# Patient Record
Sex: Female | Born: 1990 | Race: White | Hispanic: No | Marital: Married | Smoking: Never smoker
Health system: Southern US, Community
[De-identification: ages and names within clinical notes are randomized; demographics above are authoritative.]

## PROBLEM LIST (undated history)

## (undated) DIAGNOSIS — G43909 Migraine, unspecified, not intractable, without status migrainosus: Secondary | ICD-10-CM

## (undated) DIAGNOSIS — E119 Type 2 diabetes mellitus without complications: Secondary | ICD-10-CM

## (undated) DIAGNOSIS — R519 Headache, unspecified: Secondary | ICD-10-CM

## (undated) DIAGNOSIS — K649 Unspecified hemorrhoids: Secondary | ICD-10-CM

## (undated) DIAGNOSIS — R569 Unspecified convulsions: Secondary | ICD-10-CM

## (undated) DIAGNOSIS — R51 Headache: Secondary | ICD-10-CM

## (undated) DIAGNOSIS — K602 Anal fissure, unspecified: Secondary | ICD-10-CM

## (undated) HISTORY — DX: Type 2 diabetes mellitus without complications: E11.9

## (undated) HISTORY — DX: Migraine, unspecified, not intractable, without status migrainosus: G43.909

## (undated) HISTORY — PX: TUBAL LIGATION: SHX77

## (undated) HISTORY — DX: Unspecified convulsions: R56.9

## (undated) HISTORY — DX: Unspecified hemorrhoids: K64.9

## (undated) HISTORY — DX: Headache: R51

## (undated) HISTORY — DX: Headache, unspecified: R51.9

## (undated) HISTORY — DX: Anal fissure, unspecified: K60.2

---
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
## 2014-06-15 ENCOUNTER — Encounter: Payer: Self-pay | Admitting: Family Medicine

## 2014-06-15 ENCOUNTER — Telehealth: Payer: Self-pay | Admitting: Family Medicine

## 2014-06-15 ENCOUNTER — Encounter (INDEPENDENT_AMBULATORY_CARE_PROVIDER_SITE_OTHER): Payer: Self-pay

## 2014-06-15 ENCOUNTER — Ambulatory Visit: Payer: Self-pay | Admitting: Family Medicine

## 2014-06-15 VITALS — BP 116/79 | HR 100 | Temp 99.1°F | Ht 60.0 in | Wt 93.8 lb

## 2014-06-15 DIAGNOSIS — R300 Dysuria: Secondary | ICD-10-CM

## 2014-06-15 LAB — POCT UA - MICROSCOPIC ONLY
Casts, Ur, LPF, POC: NEGATIVE
Crystals, Ur, HPF, POC: NEGATIVE
RBC, urine, microscopic: NEGATIVE
Yeast, UA: NEGATIVE

## 2014-06-15 LAB — POCT URINALYSIS DIPSTICK
Bilirubin, UA: NEGATIVE
Blood, UA: NEGATIVE
Glucose, UA: NEGATIVE
Ketones, UA: NEGATIVE
Leukocytes, UA: NEGATIVE
Nitrite, UA: NEGATIVE
Spec Grav, UA: 1.01
Urobilinogen, UA: NEGATIVE
pH, UA: 8

## 2014-06-15 MED ORDER — PHENAZOPYRIDINE HCL 200 MG PO TABS: 200.0000 mg | ORAL_TABLET | Freq: Three times a day (TID) | ORAL | Status: DC | PRN

## 2014-06-15 MED ORDER — CIPROFLOXACIN HCL 500 MG PO TABS
500.0000 mg | ORAL_TABLET | Freq: Two times a day (BID) | ORAL | Status: DC
Start: 2014-06-15 — End: 2014-06-17

## 2014-06-15 NOTE — Progress Notes (Signed)
   Subjective:    Patient ID: Jessica Ferguson, female    DOB: 04/15/1990, 23 y.o.   MRN: [id number]  HPI  C/o urinary frequency and dysuria for 3 days  Review of Systems No chest pain, SOB, HA, dizziness, vision change, N/V, diarrhea, constipation, dysuria, urinary urgency or frequency, myalgias, arthralgias or rash.     Objective:   Physical Exam  Vital signs noted  Well developed well nourished female.  HEENT - Head atraumatic Normocephalic Respiratory - Lungs CTA bilateral Cardiac - RRR S1 and S2 without murmur GI - Abdomen soft Nontender and bowel sounds active x 4 Extremities - No edema. Neuro - Grossly intact.      Assessment & Plan:  Dysuria - Plan: POCT urinalysis dipstick, POCT UA - Microscopic Only, Urine culture, ciprofloxacin (CIPRO) 500 MG tablet, phenazopyridine (PYRIDIUM) 200 MG tablet  Deatra Canter FNP

## 2014-06-15 NOTE — Telephone Encounter (Signed)
appt made

## 2014-06-16 ENCOUNTER — Telehealth: Payer: Self-pay | Admitting: Family Medicine

## 2014-06-17 ENCOUNTER — Other Ambulatory Visit: Payer: Self-pay | Admitting: Family Medicine

## 2014-06-17 DIAGNOSIS — R300 Dysuria: Secondary | ICD-10-CM

## 2014-06-17 MED ORDER — PHENAZOPYRIDINE HCL 200 MG PO TABS: 200.0000 mg | ORAL_TABLET | Freq: Three times a day (TID) | ORAL | Status: DC | PRN

## 2014-06-17 MED ORDER — CIPROFLOXACIN HCL 500 MG PO TABS: 500.0000 mg | ORAL_TABLET | Freq: Two times a day (BID) | ORAL | Status: DC

## 2014-06-17 NOTE — Telephone Encounter (Signed)
The cipro and pyridium were sent when patient was seen and was re-sent today

## 2014-06-17 NOTE — Telephone Encounter (Signed)
Detailed message left that rx was resent to pharmacy.

## 2014-08-24 ENCOUNTER — Ambulatory Visit (INDEPENDENT_AMBULATORY_CARE_PROVIDER_SITE_OTHER): Payer: Self-pay | Admitting: Nurse Practitioner

## 2014-08-24 ENCOUNTER — Encounter: Payer: Self-pay | Admitting: Nurse Practitioner

## 2014-08-24 VITALS — BP 117/79 | HR 88 | Temp 99.1°F | Ht 60.0 in | Wt 95.0 lb

## 2014-08-24 DIAGNOSIS — R599 Enlarged lymph nodes, unspecified: Secondary | ICD-10-CM

## 2014-08-24 DIAGNOSIS — R590 Localized enlarged lymph nodes: Secondary | ICD-10-CM

## 2014-08-24 MED ORDER — AMOXICILLIN 875 MG PO TABS: 875.0000 mg | ORAL_TABLET | Freq: Two times a day (BID) | ORAL | Status: DC

## 2014-08-24 NOTE — Progress Notes (Signed)
   Subjective:    Patient ID: Jessica Ferguson, female    DOB: Jun 25, 1991, 23 y.o.   MRN: [id number]  HPI patient in today c/o sore throat- started 1 week ago- getting worse- starting to loose her voice.    Review of Systems  Constitutional: Positive for fever (low grade). Negative for chills and appetite change.  HENT: Positive for sore throat, trouble swallowing and voice change. Negative for congestion.   Respiratory: Positive for cough (slight ).   Cardiovascular: Negative.   Genitourinary: Negative.   Musculoskeletal: Negative.   Neurological: Negative.   Psychiatric/Behavioral: Negative.   All other systems reviewed and are negative.      Objective:   Physical Exam  Constitutional: She is oriented to person, place, and time. She appears well-developed and well-nourished. No distress.  HENT:  Right Ear: Hearing, tympanic membrane, external ear and ear canal normal.  Left Ear: Hearing, tympanic membrane, external ear and ear canal normal.  Nose: Mucosal edema and rhinorrhea present. Right sinus exhibits no maxillary sinus tenderness and no frontal sinus tenderness. Left sinus exhibits no maxillary sinus tenderness and no frontal sinus tenderness.  Mouth/Throat: Uvula is midline. No oropharyngeal exudate, posterior oropharyngeal edema, posterior oropharyngeal erythema or tonsillar abscesses.  Halitosis Left jaw edema   Eyes: Pupils are equal, round, and reactive to light.  Neck: Normal range of motion.  Cardiovascular: Normal rate, regular rhythm and normal heart sounds.   Pulmonary/Chest: Effort normal and breath sounds normal.  Lymphadenopathy:    She has cervical adenopathy (left tonsillar with tenderness).  Neurological: She is alert and oriented to person, place, and time.  Skin: Skin is warm and dry.  Psychiatric: She has a normal mood and affect. Her behavior is normal. Judgment and thought content normal.    BP 117/79 mmHg  Pulse 88  Temp(Src) 99.1 F (37.3 C)  (Oral)  Ht 5' (1.524 m)  Wt 95 lb (43.092 kg)  BMI 18.55 kg/m2       Assessment & Plan:   1. Lymphadenopathy of left cervical region    Meds ordered this encounter  Medications  . amoxicillin (AMOXIL) 875 MG tablet    Sig: Take 1 tablet (875 mg total) by mouth 2 (two) times daily.    Dispense:  20 tablet    Refill:  0    Order Specific Question:  Supervising Provider    Answer:  Deborra MedinaMOORE, DONALD W [[id number]]   Force fluids Motrin or tylenol OTC OTC decongestant Throat lozenges if help New toothbrush in 3 days  Mary-Margaret Daphine DeutscherMartin, FNP

## 2014-08-24 NOTE — Patient Instructions (Signed)
Force fluids °Motrin or tylenol OTC °OTC decongestant °Throat lozenges if help °New toothbrush in 3 days ° °

## 2014-09-24 ENCOUNTER — Ambulatory Visit: Payer: Medicaid Other

## 2014-11-01 ENCOUNTER — Encounter: Payer: Self-pay | Admitting: Family

## 2014-11-01 ENCOUNTER — Ambulatory Visit (INDEPENDENT_AMBULATORY_CARE_PROVIDER_SITE_OTHER): Payer: Self-pay | Admitting: Family

## 2014-11-01 VITALS — BP 147/87 | HR 111 | Temp 99.1°F | Ht 60.0 in | Wt 100.2 lb

## 2014-11-01 DIAGNOSIS — R509 Fever, unspecified: Secondary | ICD-10-CM

## 2014-11-01 DIAGNOSIS — N390 Urinary tract infection, site not specified: Secondary | ICD-10-CM

## 2014-11-01 DIAGNOSIS — M545 Low back pain: Secondary | ICD-10-CM

## 2014-11-01 DIAGNOSIS — N912 Amenorrhea, unspecified: Secondary | ICD-10-CM

## 2014-11-01 LAB — POCT CBC
Granulocyte percent: 74.2 %G (ref 37–80)
HEMATOCRIT: 42.7 % (ref 37.7–47.9)
Hemoglobin: 13.6 g/dL (ref 12.2–16.2)
Lymph, poc: 1.7 (ref 0.6–3.4)
MCH, POC: 27.3 pg (ref 27–31.2)
MCHC: 31.9 g/dL (ref 31.8–35.4)
MCV: 85.7 fL (ref 80–97)
MPV: 10.2 fL (ref 0–99.8)
PLATELET COUNT, POC: 191 10*3/uL (ref 142–424)
POC GRANULOCYTE: 6.2 (ref 2–6.9)
POC LYMPH PERCENT: 20.2 %L (ref 10–50)
RBC: 5 M/uL (ref 4.04–5.48)
RDW, POC: 12.6 %
WBC: 8.4 10*3/uL (ref 4.6–10.2)

## 2014-11-01 LAB — POCT URINALYSIS DIPSTICK
BILIRUBIN UA: NEGATIVE
Blood, UA: NEGATIVE
GLUCOSE UA: NEGATIVE
KETONES UA: NEGATIVE
LEUKOCYTES UA: NEGATIVE
Nitrite, UA: NEGATIVE
Protein, UA: NEGATIVE
Urobilinogen, UA: NEGATIVE
pH, UA: 8

## 2014-11-01 LAB — POCT UA - MICROSCOPIC ONLY
CRYSTALS, UR, HPF, POC: NEGATIVE
Casts, Ur, LPF, POC: NEGATIVE
Mucus, UA: NEGATIVE
YEAST UA: NEGATIVE

## 2014-11-01 LAB — POCT URINE PREGNANCY: Preg Test, Ur: NEGATIVE

## 2014-11-01 MED ORDER — SULFAMETHOXAZOLE-TRIMETHOPRIM 800-160 MG PO TABS: 1.0000 | ORAL_TABLET | Freq: Two times a day (BID) | ORAL | Status: DC

## 2014-11-01 MED ORDER — CYCLOBENZAPRINE HCL 5 MG PO TABS: 5.0000 mg | ORAL_TABLET | Freq: Three times a day (TID) | ORAL | Status: DC | PRN

## 2014-11-01 NOTE — Progress Notes (Signed)
   Subjective:    Patient ID: Jessica Ferguson, female    DOB: 04-17-1991, 24 y.o.   MRN: [id number]  HPI Pt presents to the office with back pain and nipple pain. Pt states she can not sleep on her back, eating a lot more than usual, and has missed her period. Pt states she has taken two pregnancy test at home which were both negative. Pt states she is not sexual "anymore" since becoming "bloated my sex drive is gone".    Review of Systems  Constitutional: Positive for fatigue.  HENT: Negative.   Eyes: Negative.   Respiratory: Negative.  Negative for shortness of breath.   Cardiovascular: Negative.  Negative for palpitations.  Gastrointestinal: Positive for nausea. Negative for vomiting.  Endocrine: Negative.   Genitourinary: Positive for vaginal discharge. Negative for dysuria and pelvic pain.  Musculoskeletal: Negative.   Neurological: Negative.  Negative for headaches.  Hematological: Negative.   Psychiatric/Behavioral: Negative.   All other systems reviewed and are negative.      Objective:   Physical Exam  Constitutional: She is oriented to person, place, and time. She appears well-developed and well-nourished. No distress.  Eyes: Pupils are equal, round, and reactive to light.  Neck: Normal range of motion. Neck supple. No thyromegaly present.  Cardiovascular: Normal rate, regular rhythm, normal heart sounds and intact distal pulses.   No murmur heard. Pulmonary/Chest: Effort normal and breath sounds normal. No respiratory distress. She has no wheezes.  Abdominal: Soft. Bowel sounds are normal. She exhibits no distension. There is no tenderness.  Musculoskeletal: Normal range of motion. She exhibits no edema or tenderness.  Neurological: She is alert and oriented to person, place, and time. She has normal reflexes. No cranial nerve deficit.  Skin: Skin is warm and dry.  Psychiatric: She has a normal mood and affect. Her behavior is normal. Judgment and thought content  normal.  Vitals reviewed.    BP 147/87 mmHg  Pulse 111  Temp(Src) 99.1 F (37.3 C) (Oral)  Ht 5' (1.524 m)  Wt 100 lb 3.2 oz (45.45 kg)  BMI 19.57 kg/m2  LMP 09/17/2014      Assessment & Plan:  1. Amenorrhea - POCT urine pregnancy  2. Low back pain, unspecified back pain laterality, with sciatica presence unspecified -Rest -Ice -Sedation precaution discussed - POCT UA - Microscopic Only - POCT urinalysis dipstick - POCT CBC - cyclobenzaprine (FLEXERIL) 5 MG tablet; Take 1 tablet (5 mg total) by mouth 3 (three) times daily as needed for muscle spasms.  Dispense: 30 tablet; Refill: 1  3. Fever, unspecified fever cause - POCT CBC  4. Urinary tract infection without hematuria, site unspecified Force fluids AZO over the counter X2 days RTO prn - sulfamethoxazole-trimethoprim (BACTRIM DS,SEPTRA DS) 800-160 MG per tablet; Take 1 tablet by mouth 2 (two) times daily.  Dispense: 10 tablet; Refill: 0   Jannifer Rodneyhristy Jennice Renegar, FNP

## 2014-11-01 NOTE — Patient Instructions (Signed)
Back Pain, Adult Low back pain is very common. About 1 in 5 people have back pain.The cause of low back pain is rarely dangerous. The pain often gets better over time.About half of people with a sudden onset of back pain feel better in just 2 weeks. About 8 in 10 people feel better by 6 weeks.  CAUSES Some common causes of back pain include:  Strain of the muscles or ligaments supporting the spine.  Wear and tear (degeneration) of the spinal discs.  Arthritis.  Direct injury to the back. DIAGNOSIS Most of the time, the direct cause of low back pain is not known.However, back pain can be treated effectively even when the exact cause of the pain is unknown.Answering your caregiver's questions about your overall health and symptoms is one of the most accurate ways to make sure the cause of your pain is not dangerous. If your caregiver needs more information, he or she may order lab work or imaging tests (X-rays or MRIs).However, even if imaging tests show changes in your back, this usually does not require surgery. HOME CARE INSTRUCTIONS For many people, back pain returns.Since low back pain is rarely dangerous, it is often a condition that people can learn to manageon their own.   Remain active. It is stressful on the back to sit or stand in one place. Do not sit, drive, or stand in one place for more than 30 minutes at a time. Take short walks on level surfaces as soon as pain allows.Try to increase the length of time you walk each day.  Do not stay in bed.Resting more than 1 or 2 days can delay your recovery.  Do not avoid exercise or work.Your body is made to move.It is not dangerous to be active, even though your back may hurt.Your back will likely heal faster if you return to being active before your pain is gone.  Pay attention to your body when you bend and lift. Many people have less discomfortwhen lifting if they bend their knees, keep the load close to their bodies,and  avoid twisting. Often, the most comfortable positions are those that put less stress on your recovering back.  Find a comfortable position to sleep. Use a firm mattress and lie on your side with your knees slightly bent. If you lie on your back, put a pillow under your knees.  Only take over-the-counter or prescription medicines as directed by your caregiver. Over-the-counter medicines to reduce pain and inflammation are often the most helpful.Your caregiver may prescribe muscle relaxant drugs.These medicines help dull your pain so you can more quickly return to your normal activities and healthy exercise.  Put ice on the injured area.  Put ice in a plastic bag.  Place a towel between your skin and the bag.  Leave the ice on for 15-20 minutes, 03-04 times a day for the first 2 to 3 days. After that, ice and heat may be alternated to reduce pain and spasms.  Ask your caregiver about trying back exercises and gentle massage. This may be of some benefit.  Avoid feeling anxious or stressed.Stress increases muscle tension and can worsen back pain.It is important to recognize when you are anxious or stressed and learn ways to manage it.Exercise is a great option. SEEK MEDICAL CARE IF:  You have pain that is not relieved with rest or medicine.  You have pain that does not improve in 1 week.  You have new symptoms.  You are generally not feeling well. SEEK   IMMEDIATE MEDICAL CARE IF:   You have pain that radiates from your back into your legs.  You develop new bowel or bladder control problems.  You have unusual weakness or numbness in your arms or legs.  You develop nausea or vomiting.  You develop abdominal pain.  You feel faint. Document Released: 10/08/2005 Document Revised: 04/08/2012 Document Reviewed: 02/09/2014 ExitCare Patient Information 2015 [employer]. This information is not intended to replace advice given to you by your health care provider. Make sure you  discuss any questions you have with your health care provider.  

## 2016-05-14 ENCOUNTER — Ambulatory Visit (INDEPENDENT_AMBULATORY_CARE_PROVIDER_SITE_OTHER): Payer: Self-pay | Admitting: Family

## 2016-05-14 VITALS — BP 111/75 | HR 82 | Temp 97.5°F | Ht 60.0 in | Wt 93.2 lb

## 2016-05-14 DIAGNOSIS — J029 Acute pharyngitis, unspecified: Secondary | ICD-10-CM

## 2016-05-14 DIAGNOSIS — M545 Low back pain: Secondary | ICD-10-CM

## 2016-05-14 DIAGNOSIS — J069 Acute upper respiratory infection, unspecified: Secondary | ICD-10-CM

## 2016-05-14 LAB — RAPID STREP SCREEN (MED CTR MEBANE ONLY): Strep Gp A Ag, IA W/Reflex: NEGATIVE

## 2016-05-14 LAB — CULTURE, GROUP A STREP

## 2016-05-14 MED ORDER — CYCLOBENZAPRINE HCL 5 MG PO TABS: 5.0000 mg | ORAL_TABLET | Freq: Three times a day (TID) | ORAL | 1 refills | Status: DC | PRN

## 2016-05-14 NOTE — Addendum Note (Signed)
Addended by: Jannifer Rodney A on: 05/14/2016 05:28 PM   Modules accepted: Orders

## 2016-05-14 NOTE — Patient Instructions (Signed)
Upper Respiratory Infection, Adult Most upper respiratory infections (URIs) are a viral infection of the air passages leading to the lungs. A URI affects the nose, throat, and upper air passages. The most common type of URI is nasopharyngitis and is typically referred to as "the common cold." URIs run their course and usually go away on their own. Most of the time, a URI does not require medical attention, but sometimes a bacterial infection in the upper airways can follow a viral infection. This is called a secondary infection. Sinus and middle ear infections are common types of secondary upper respiratory infections. Bacterial pneumonia can also complicate a URI. A URI can worsen asthma and chronic obstructive pulmonary disease (COPD). Sometimes, these complications can require emergency medical care and may be life threatening.  CAUSES Almost all URIs are caused by viruses. A virus is a type of germ and can spread from one person to another.  RISKS FACTORS You may be at risk for a URI if:   You smoke.   You have chronic heart or lung disease.  You have a weakened defense (immune) system.   You are very young or very old.   You have nasal allergies or asthma.  You work in crowded or poorly ventilated areas.  You work in health care facilities or schools. SIGNS AND SYMPTOMS  Symptoms typically develop 2-3 days after you come in contact with a cold virus. Most viral URIs last 7-10 days. However, viral URIs from the influenza virus (flu virus) can last 14-18 days and are typically more severe. Symptoms may include:   Runny or stuffy (congested) nose.   Sneezing.   Cough.   Sore throat.   Headache.   Fatigue.   Fever.   Loss of appetite.   Pain in your forehead, behind your eyes, and over your cheekbones (sinus pain).  Muscle aches.  DIAGNOSIS  Your health care provider may diagnose a URI by:  Physical exam.  Tests to check that your symptoms are not due to  another condition such as:  Strep throat.  Sinusitis.  Pneumonia.  Asthma. TREATMENT  A URI goes away on its own with time. It cannot be cured with medicines, but medicines may be prescribed or recommended to relieve symptoms. Medicines may help:  Reduce your fever.  Reduce your cough.  Relieve nasal congestion. HOME CARE INSTRUCTIONS   Take medicines only as directed by your health care provider.   Gargle warm saltwater or take cough drops to comfort your throat as directed by your health care provider.  Use a warm mist humidifier or inhale steam from a shower to increase air moisture. This may make it easier to breathe.  Drink enough fluid to keep your urine clear or pale yellow.   Eat soups and other clear broths and maintain good nutrition.   Rest as needed.   Return to work when your temperature has returned to normal or as your health care provider advises. You may need to stay home longer to avoid infecting others. You can also use a face mask and careful hand washing to prevent spread of the virus.  Increase the usage of your inhaler if you have asthma.   Do not use any tobacco products, including cigarettes, chewing tobacco, or electronic cigarettes. If you need help quitting, ask your health care provider. PREVENTION  The best way to protect yourself from getting a cold is to practice good hygiene.   Avoid oral or hand contact with people with cold   symptoms.   Wash your hands often if contact occurs.  There is no clear evidence that vitamin C, vitamin E, echinacea, or exercise reduces the chance of developing a cold. However, it is always recommended to get plenty of rest, exercise, and practice good nutrition.  SEEK MEDICAL CARE IF:   You are getting worse rather than better.   Your symptoms are not controlled by medicine.   You have chills.  You have worsening shortness of breath.  You have brown or red mucus.  You have yellow or brown nasal  discharge.  You have pain in your face, especially when you bend forward.  You have a fever.  You have swollen neck glands.  You have pain while swallowing.  You have white areas in the back of your throat. SEEK IMMEDIATE MEDICAL CARE IF:   You have severe or persistent:  Headache.  Ear pain.  Sinus pain.  Chest pain.  You have chronic lung disease and any of the following:  Wheezing.  Prolonged cough.  Coughing up blood.  A change in your usual mucus.  You have a stiff neck.  You have changes in your:  Vision.  Hearing.  Thinking.  Mood. MAKE SURE YOU:   Understand these instructions.  Will watch your condition.  Will get help right away if you are not doing well or get worse.   This information is not intended to replace advice given to you by your health care provider. Make sure you discuss any questions you have with your health care provider.   Document Released: 04/03/2001 Document Revised: 02/22/2015 Document Reviewed: 01/13/2014 Elsevier Interactive Patient Education 2016 [employer]  - Take meds as prescribed - Use a cool mist humidifier  -Use saline nose sprays frequently -Saline irrigations of the nose can be very helpful if done frequently.  * 4X daily for 1 week*  * Use of a nettie pot can be helpful with this. Follow directions with this* -Force fluids -For any cough or congestion  Use plain Mucinex- regular strength or max strength is fine   * Children- consult with Pharmacist for dosing -For fever or aces or pains- take tylenol or ibuprofen appropriate for age and weight.  * for fevers greater than 101 orally you may alternate ibuprofen and tylenol every  3 hours. -Throat lozenges if help -New toothbrush in 3 days   Saburo Luger, FNP  

## 2016-05-14 NOTE — Progress Notes (Signed)
   Subjective:    Patient ID: Jessica Ferguson, female    DOB: 05/29/1991, 25 y.o.   MRN: [id number]  Emesis   This is a new problem. The current episode started yesterday. The problem occurs 2 to 4 times per day. The problem has been unchanged. The emesis has an appearance of stomach contents. There has been no fever. Associated symptoms include coughing. Pertinent negatives include no chills, dizziness or headaches. She has tried bed rest and increased fluids for the symptoms. The treatment provided mild relief.  Cough  Associated symptoms include ear pain and a sore throat. Pertinent negatives include no chills or headaches.      Review of Systems  Constitutional: Negative for chills.  HENT: Positive for ear pain, sinus pressure and sore throat.   Respiratory: Positive for cough.   Gastrointestinal: Positive for vomiting.  Neurological: Negative for dizziness and headaches.       Objective:   Physical Exam  Constitutional: She is oriented to person, place, and time. She appears well-developed and well-nourished. No distress.  HENT:  Head: Normocephalic and atraumatic.  Nose: Mucosal edema and rhinorrhea present. Right sinus exhibits maxillary sinus tenderness. Left sinus exhibits maxillary sinus tenderness.  Mouth/Throat: Posterior oropharyngeal edema present.  Eyes: Pupils are equal, round, and reactive to light.  Neck: Normal range of motion. Neck supple. No thyromegaly present.  Cardiovascular: Normal rate, regular rhythm, normal heart sounds and intact distal pulses.   No murmur heard. Pulmonary/Chest: Effort normal and breath sounds normal. No respiratory distress. She has no wheezes.  Abdominal: Soft. Bowel sounds are normal. She exhibits no distension. There is no tenderness.  Musculoskeletal: Normal range of motion. She exhibits no edema or tenderness.  Neurological: She is alert and oriented to person, place, and time. She has normal reflexes. No cranial nerve deficit.    Skin: Skin is warm and dry.  Psychiatric: She has a normal mood and affect. Her behavior is normal. Judgment and thought content normal.  Vitals reviewed.     BP 111/75   Pulse 82   Temp 97.5 F (36.4 C) (Oral)   Ht 5' (1.524 m)   Wt 93 lb 3.2 oz (42.3 kg)   BMI 18.20 kg/m      Assessment & Plan:  1. Acute upper respiratory infection -- Take meds as prescribed - Use a cool mist humidifier  -Use saline nose sprays frequently -Saline irrigations of the nose can be very helpful if done frequently.  * 4X daily for 1 week*  * Use of a nettie pot can be helpful with this. Follow directions with this* -Force fluids -For any cough or congestion  Use plain Mucinex- regular strength or max strength is fine   * Children- consult with Pharmacist for dosing -For fever or aces or pains- take tylenol or ibuprofen appropriate for age and weight.  * for fevers greater than 101 orally you may alternate ibuprofen and tylenol every  3 hours. -Throat lozenges if help -New toothbrush in 3 days   2. Sore throat  Jannifer Rodney, FNP  - Rapid strep screen (not at [facility])

## 2016-05-17 ENCOUNTER — Encounter: Payer: Self-pay | Admitting: Pediatrics

## 2016-05-17 ENCOUNTER — Ambulatory Visit (INDEPENDENT_AMBULATORY_CARE_PROVIDER_SITE_OTHER): Payer: Self-pay | Admitting: Pediatrics

## 2016-05-17 VITALS — BP 118/79 | HR 83 | Temp 98.6°F | Ht 60.0 in | Wt 92.6 lb

## 2016-05-17 DIAGNOSIS — Z01419 Encounter for gynecological examination (general) (routine) without abnormal findings: Secondary | ICD-10-CM

## 2016-05-17 DIAGNOSIS — Z0000 Encounter for general adult medical examination without abnormal findings: Secondary | ICD-10-CM

## 2016-05-17 DIAGNOSIS — J069 Acute upper respiratory infection, unspecified: Secondary | ICD-10-CM

## 2016-05-17 NOTE — Patient Instructions (Signed)
Take a prenatal vitamin daily.

## 2016-05-17 NOTE — Progress Notes (Signed)
    Subjective:    Patient ID: Jessica Ferguson, female    DOB: [birth year], 25 y.o.   MRN: [id number]  CC: Annual Exam and Gynecologic Exam   HPI: Jessica Ferguson is a 25 y.o. female presenting for Annual Exam and Gynecologic Exam  Interested in having children Was on depo-provera--last shot about a year Was using barrier protection Having periods every 4-5 weeks Last for about 4 days Heavy bleeding, clotting during   Fam hx: no breast or colon cancer   Depression screen [facility] 2/9 05/14/2016 11/01/2014 06/15/2014  Decreased Interest 0 0 0  Down, Depressed, Hopeless 0 0 0  PHQ - 2 Score 0 0 0     Relevant past medical, surgical, family and social history reviewed and updated as indicated.  Interim medical history since our last visit reviewed. Allergies and medications reviewed and updated.  ROS: All systems negative other than what is in HPI  History  Smoking Status  . Never Smoker  Smokeless Tobacco  . Never Used       Objective:    BP 118/79 (BP Location: Right Arm, Patient Position: Sitting, Cuff Size: Normal)   Pulse 83   Temp 98.6 F (37 C) (Oral)   Ht 5' (1.524 m)   Wt 92 lb 9.6 oz (42 kg)   BMI 18.08 kg/m   Wt Readings from Last 3 Encounters:  05/17/16 92 lb 9.6 oz (42 kg)  05/14/16 93 lb 3.2 oz (42.3 kg)  11/01/14 100 lb 3.2 oz (45.5 kg)      Gen: NAD, alert, cooperative with exam, NCAT, slightly congested EYES: EOMI, no scleral injection or icterus ENT:  TMs pearly gray b/l, OP without erythema LYMPH: no cervical LAD CV: NRRR, normal S1/S2, no murmur, distal pulses 2+ b/l Resp: CTABL, no wheezes, normal WOB Abd: +BS, soft, NTND. no guarding or organomegaly Ext: No edema, warm Neuro: Alert and oriented MSK: normal muscle bulk GU: normal external female genitalia, normal appearing cervix, minimal white thin secretions in vaginal vault     Assessment & Plan:    Jessica Ferguson was seen today for annual exam and gynecologic exam.  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

## 2016-05-20 LAB — PAP IG W/ RFLX HPV ASCU: PAP SMEAR COMMENT: 0

## 2016-05-22 ENCOUNTER — Telehealth: Payer: Self-pay | Admitting: *Deleted

## 2016-05-22 MED ORDER — BENZONATATE 200 MG PO CAPS: 200.0000 mg | ORAL_CAPSULE | Freq: Three times a day (TID) | ORAL | 1 refills | Status: DC | PRN

## 2016-05-22 NOTE — Telephone Encounter (Signed)
FYI,       Patient complains of a cough. Not willing to take any medicine that would make her sleepy.  She is sure it has nothing to do with sinus issues.   The claritin has not helped her sore throat or cough.  She does not have any nasal congestion or chest congestion. She does not know what to do about the cough since it makes her chest sore and throat also.

## 2016-05-22 NOTE — Telephone Encounter (Signed)
Left message to pick up Tessalon pills at pharmacy .  The medicine should help with coughing and not have a side effect of sleepiness.  Please call back to let me know you received my message.

## 2016-05-22 NOTE — Telephone Encounter (Signed)
Tessalon rx sent to pharmacy. Medication will not make you sleepy

## 2016-05-22 NOTE — Telephone Encounter (Signed)
lmtcb

## 2016-05-28 ENCOUNTER — Ambulatory Visit (INDEPENDENT_AMBULATORY_CARE_PROVIDER_SITE_OTHER): Payer: Self-pay

## 2016-05-28 ENCOUNTER — Ambulatory Visit (INDEPENDENT_AMBULATORY_CARE_PROVIDER_SITE_OTHER): Payer: Self-pay | Admitting: Pediatrics

## 2016-05-28 ENCOUNTER — Encounter: Payer: Self-pay | Admitting: Pediatrics

## 2016-05-28 VITALS — BP 106/72 | HR 81 | Temp 97.1°F | Ht 60.0 in | Wt 95.4 lb

## 2016-05-28 DIAGNOSIS — M25519 Pain in unspecified shoulder: Secondary | ICD-10-CM

## 2016-05-28 DIAGNOSIS — M25511 Pain in right shoulder: Secondary | ICD-10-CM

## 2016-05-28 NOTE — Progress Notes (Signed)
Subjective:    Patient ID: Jessica Ferguson, female    DOB: June 07, 1991, 25 y.o.   MRN: [id number]  CC: Shoulder Pain   HPI: Jessica Ferguson is a 25 y.o. female presenting for Shoulder Pain  Hurt R shoulder 8-10 years ago Took six months for her to get full use of shoulder back Didn't see a doctor at that time Doesn't think she broke anything but doesn't know Ongoing R shoulder blade pain/soreness since then Then 2 days ago fell to her R side while sitting on side of pool onto the cement Had instant sharp pain in R shoulder and neck Was already hurting some from going to jump world/trampolines Also using arm at work a lot Now pain with ROM over apprx 45 degrees in shoulder Pain in muscles on top of R shoulder Feels like her hand and forearm are dull, slightly numb compared to other hand This happened with previous injury as well though was her entire arm Strength R hand limited by pain, has shooting pains down her arm when she makes a fist Has not tried anything for pain   [facility] 05/28/2016 05/14/2016 11/01/2014 06/15/2014  Decreased Interest 0 0 0 0  Down, Depressed, Hopeless 0 0 0 0  PHQ - 2 Score 0 0 0 0     Relevant past medical, surgical, family and social history reviewed and updated. Interim medical history since our last visit reviewed. Allergies and medications reviewed and updated.  History  Smoking Status  . Never Smoker  Smokeless Tobacco  . Never Used    ROS: Per HPI      Objective:    BP 106/72 (BP Location: Left Arm, Patient Position: Sitting, Cuff Size: Normal)   Pulse 81   Temp 97.1 F (36.2 C) (Oral)   Ht 5' (1.524 m)   Wt 95 lb 6.4 oz (43.3 kg)   BMI 18.63 kg/m   Wt Readings from Last 3 Encounters:  05/28/16 95 lb 6.4 oz (43.3 kg)  05/17/16 92 lb 9.6 oz (42 kg)  05/14/16 93 lb 3.2 oz (42.3 kg)     Gen: NAD, alert, cooperative with exam, NCAT EYES: EOMI, no scleral injection or icterus LYMPH: no cervical LAD CV:  NRRR, normal S1/S2, no murmur, distal pulses 2+ b/l Resp: CTABL, no wheezes, normal WOB Abd: +BS, soft, NTND. no guarding or organomegaly Ext: No edema, warm Neuro: Alert and oriented, strength equal b/l UE and LE, coordination grossly normal MSK: decreased passive and active ROM of R shoulder, active limited to apprx 45 degrees, passive to 80 degrees, not quite able to get to 90 degrees No point tenderness over cervical spine or AC joint Clavicles even TTP over soft tissue top of shoulder, also with squeeze along R upper arm Strength 5/5 L hand, 4/5 L hand but with variable effort     Assessment & Plan:    Jessica Ferguson was seen today for shoulder pain after falling on R shoulder from sitting. Xray without evidence of fracture.  Unclear reason for numbness given mechanism of injury and exam Pt without insurance, not interested in further imaging at this time Discussed ROM shoulder exercises to prevent frozen shoulder Can wear sling for comfort Note for work given Will let me know if symptoms not improving  Diagnoses and all orders for this visit:  Arthralgia of shoulder, unspecified laterality -     DG Shoulder Right; Future  Follow up plan: 3 weeks, sooner if needed  Jessica Ferguson  Jessica Done, MD [facility] 05/28/2016, 12:16 PM

## 2016-05-28 NOTE — Patient Instructions (Addendum)
Xray today of shoulder   Shoulder Range of Motion Exercises Shoulder range of motion (ROM) exercises are designed to keep the shoulder moving freely. They are often recommended for people who have shoulder pain. MOVEMENT EXERCISE When you are able, do this exercise 5-6 days per week, or as told by your health care provider. Work toward doing 2 sets of 10 swings. Pendulum Exercise How To Do This Exercise Lying Down 1. Lie face-down on a bed with your abdomen close to the side of the bed. 2. Let your arm hang over the side of the bed. 3. Relax your shoulder, arm, and hand. 4. Slowly and gently swing your arm forward and back. Do not use your neck muscles to swing your arm. They should be relaxed. If you are struggling to swing your arm, have someone gently swing it for you. When you do this exercise for the first time, swing your arm at a 15 degree angle for 15 seconds, or swing your arm 10 times. As pain lessens over time, increase the angle of the swing to 30-45 degrees. 5. Repeat steps 1-4 with the other arm. How To Do This Exercise While Standing 1. Stand next to a sturdy chair or table and hold on to it with your hand.  Bend forward at the waist.  Bend your knees slightly.  Relax your other arm and let it hang limp.  Relax the shoulder blade of the arm that is hanging and let it drop.  While keeping your shoulder relaxed, use body motion to swing your arm in small circles. The first time you do this exercise, swing your arm for about 30 seconds or 10 times. When you do it next time, swing your arm for a little longer.  Stand up tall and relax.  Repeat steps 1-7, this time changing the direction of the circles. 2. Repeat steps 1-8 with the other arm. STRETCHING EXERCISES Do these exercises 3-4 times per day on 5-6 days per week or as told by your health care provider. Work toward holding the stretch for 20 seconds. Stretching Exercise 1 1. Lift your arm straight out in front of  you. 2. Bend your arm 90 degrees at the elbow (right angle) so your forearm goes across your body and looks like the letter "L." 3. Use your other arm to gently pull the elbow forward and across your body. 4. Repeat steps 1-3 with the other arm. Stretching Exercise 2 You will need a towel or rope for this exercise. 1. Bend one arm behind your back with the palm facing outward. 2. Hold a towel with your other hand. 3. Reach the arm that holds the towel above your head, and bend that arm at the elbow. Your wrist should be behind your neck. 4. Use your free hand to grab the free end of the towel. 5. With the higher hand, gently pull the towel up behind you. 6. With the lower hand, pull the towel down behind you. 7. Repeat steps 1-6 with the other arm.

## 2016-05-29 ENCOUNTER — Telehealth: Payer: Self-pay | Admitting: Pediatrics

## 2016-05-29 NOTE — Telephone Encounter (Signed)
Spoke with pt to inform notes will have to be requested by attorney She verbalizes understanding

## 2016-06-04 ENCOUNTER — Telehealth: Payer: Self-pay | Admitting: Pediatrics

## 2016-06-04 ENCOUNTER — Ambulatory Visit (INDEPENDENT_AMBULATORY_CARE_PROVIDER_SITE_OTHER): Payer: Self-pay | Admitting: Pediatrics

## 2016-06-04 ENCOUNTER — Encounter: Payer: Self-pay | Admitting: Pediatrics

## 2016-06-04 VITALS — BP 105/70 | HR 85 | Temp 98.9°F | Ht 60.0 in | Wt 96.0 lb

## 2016-06-04 DIAGNOSIS — M25511 Pain in right shoulder: Secondary | ICD-10-CM

## 2016-06-04 NOTE — Progress Notes (Signed)
Subjective:    Patient ID: Jessica Ferguson, female    DOB: August 01, 1991, 25 y.o.   MRN: [id number]  CC: Follow-up (Shoulder pain)   HPI: Jessica Ferguson is a 25 y.o. female presenting for Follow-up (Shoulder pain)  Says she was hit on 8/4 by a cart at work, hit her between her r shoulder blade and spine Since then started having R arm, shoulder and neck pain Says she had thought it was the fall at the pool onto her R shoulder on cement at last visit a week ago, but after talking with her family about when her symptoms began thinks it started the day before Taking 2 tabs of ibuprofen once a day Using ice on shoulder Numbness went away in R hand since last visit, now with some burning in her hand Burning all the time in R hand R side of neck When she moves the wrong way or reaches out with her R arm or her shoulder/clavicle/back are touched wrong, she has pain that shoots up her arm, or up her back into the R side of her neck, behind her R ear and into her R jaw She has been doing the gentle ROM exercises for shoulder and neck Was helping at first, the last couple of days pain has gotten worse in her arm, shoulder, back and neck  Relevant past medical, surgical, family and social history reviewed. Interim medical history since our last visit reviewed. Allergies and medications reviewed and updated.  History  Smoking Status  . Never Smoker  Smokeless Tobacco  . Never Used    ROS: Per HPI      Objective:    BP 105/70   Pulse 85   Temp 98.9 F (37.2 C) (Oral)   Ht 5' (1.524 m)   Wt 96 lb (43.5 kg)   BMI 18.75 kg/m   Wt Readings from Last 3 Encounters:  06/04/16 96 lb (43.5 kg)  05/28/16 95 lb 6.4 oz (43.3 kg)  05/17/16 92 lb 9.6 oz (42 kg)    Gen: NAD, alert, cooperative with exam, NCAT EYES: EOMI, no conjunctival injection, or no icterus LYMPH: no cervical LAD CV: WWP Resp: normal WOB Ext: No edema, warm Neuro: Alert and oriented, hand grip 4/5 R hand, 5/5 L  hand, BR reflex 2+ bilaterally, triceps reflex 2+ L side, 1+ R side but pt not able to fully relax arm due to pain despite multiple attempts Sensation: has "tingling" sensation R side of spine between spine and R shoulder blade, feels different than L side same region. Normal sensation lateral side of R shoulder blade. R hand (palm, dorsum and fingers), all sides forearm, all sides upper arm with tingling with soft touch and can't feel cold the same as L hand.  MSK: Can move chin to chest, turn head apprx 20 degrees to the L, not able to turn head past midline to R due to pain in R side of neck.  Shoulder normal internal/external rotation of L/R shoulder, passive and against resistance, as long as shoulder not extended and held at side. Pain with R shoulder extension, able to extend no more than 80 degrees passive or actively, limited by pain. No tenderness over spiny processes of spine. No worsening of symptoms with spine palpation TTP paraspinous muscles medial to R shoulder blade     Assessment & Plan:  Jessica Ferguson was seen today for follow-up R shoulder pain.  Diagnoses and all orders for this visit:  Pain in joint  of right shoulder  Tingling not consistent with dermatome distributions. Pain in back up to neck may be from muscle spasm. Discussed options with pt. Will continue conservative management for now, NSAIDs, rest, gentle ROM of R shoulder and neck. If no improvement in a couple of weeks will talk again about further imaging with MRI.  Will let me know if anything worsens.   Follow up plan: Return in about 3 weeks (around 06/25/2016), or or sooner if worsening symptoms.  Rex Krasarol Vincent, MD [facility] 06/04/2016, 4:26 PM

## 2016-06-04 NOTE — Patient Instructions (Signed)
Ibuprofen 600mg  three times a day Take with food Do not take if you are pregnant  Try aspercreme or anacreme or other topical cream with lidocaine or diclofenac in it to help with the pain.  Continue gentle range of motion of neck and shoulder.

## 2016-06-04 NOTE — Telephone Encounter (Signed)
Scheduled appointment with Dr. Oswaldo DoneVincent for today at 3:30.

## 2016-06-14 ENCOUNTER — Telehealth: Payer: Self-pay | Admitting: Pediatrics

## 2016-06-14 DIAGNOSIS — M25511 Pain in right shoulder: Secondary | ICD-10-CM

## 2016-06-15 ENCOUNTER — Encounter: Payer: Self-pay | Admitting: Pediatrics

## 2016-06-15 NOTE — Telephone Encounter (Deleted)
Still with pain in arm Taking motrin regularly Pain sometimes goes up to more than 10/10 Headache on R side of head at times, neck, down arm Arm continues to throb

## 2016-06-15 NOTE — Telephone Encounter (Signed)
Still with pain in arm Taking motrin regularly Pain sometimes goes up to more than 10/10 Headache on R side of head at times, neck, down arm Arm continues to throb at times She is trying to increase activity with arm, but makes pain worse WIll refer to sports medicine/orthopedics Printed out letter for work for another 2 weeks, return to work 9/11

## 2016-06-27 ENCOUNTER — Ambulatory Visit: Payer: Self-pay | Admitting: Pediatrics

## 2016-06-29 ENCOUNTER — Encounter: Payer: Self-pay | Admitting: Pediatrics

## 2016-06-29 ENCOUNTER — Ambulatory Visit (INDEPENDENT_AMBULATORY_CARE_PROVIDER_SITE_OTHER): Payer: Self-pay | Admitting: Pediatrics

## 2016-06-29 VITALS — BP 118/73 | HR 76 | Temp 99.4°F | Ht 60.0 in | Wt 97.0 lb

## 2016-06-29 DIAGNOSIS — R569 Unspecified convulsions: Secondary | ICD-10-CM

## 2016-06-29 DIAGNOSIS — M25511 Pain in right shoulder: Secondary | ICD-10-CM

## 2016-06-29 NOTE — Progress Notes (Signed)
Subjective:   Patient ID: Jessica Ferguson, female    DOB: 1991/04/07, 25 y.o.   MRN: [id number] CC: Follow-up (Shoulder pain) and Spasms (Sizure like few seconds to minutes)  HPI: Jessica Ferguson is a 25 y.o. female presenting for Follow-up (Shoulder pain) and Spasms (Sizure like few seconds to minutes)  R Shoulder hurts still Says she is not able to do much with her R arm because of the pain Keeps it in a sling Takes it out of sling to eat That alone causes some shoulder pain, sometimes spasms or cold chills down her arm Sometimes cold chill/spasms go through her whole body These epsiodes started a couple of weeks ago Sometimes lasting a couple of seconds, sometimes lasting up to a minute Has to have someone "snap her out of it" when it lasts longer than a coupl eof seconds Her fiance is with her today, says he has to grab and talk her out the shaking Says her whole body is shaking Difficult to get history of what the shaking looks like from him or the pt  Two days ago had episode where she lost contorl of her body, started shaking all over Gets very tense Wants to cry afterwards Once had slurred speech after the episodes When her back pops, has more episodes Pt says her R eye rolls back sometimes during episodes, not usually her L eye Asked the fiance if he ca confirm this and he says he isnt sure  Fiance says his ex-wife had epilepsy and the episodes look similar to that She says she is aware throughout the episode Sometimes starts in just one arm and goes to rest of body She is very tired after the episodes Pt always remembers the episodes No LOC No loss of bladder/bowel function These episodes are happening daily Pt can remember when it is happening, can't snap herself out of it, says she has never been by herself Has happened a couple of times in her sleep per her fiance  Continues to have abnormal sensation R arm and hand compared with L  Relevant past medical,  surgical, family and social history reviewed. Allergies and medications reviewed and updated. History  Smoking Status  . Never Smoker  Smokeless Tobacco  . Never Used   ROS: Per HPI   Objective:    BP 118/73   Pulse 76   Temp 99.4 F (37.4 C) (Oral)   Ht 5' (1.524 m)   Wt 97 lb (44 kg)   BMI 18.94 kg/m   Wt Readings from Last 3 Encounters:  06/29/16 97 lb (44 kg)  06/04/16 96 lb (43.5 kg)  05/28/16 95 lb 6.4 oz (43.3 kg)    Gen: NAD, alert, cooperative with exam, NCAT, a few times during interview/exam had a non-symmetric shaking of shoulders/trunk back and forth EYES: EOMI, no conjunctival injection, or no icterus ENT:  OP without erythema LYMPH: no cervical LAD CV: NRRR, normal S1/S2, no murmur, distal pulses 2+ b/l Resp: CTABL, no wheezes, normal WOB Ext: No edema, warm Neuro: Alert and oriented, hand grip equal b/l UE, decreased sensation to touch R hand, arm, shoulder blade. Strength R elbow ext/flex limited by pain in shoulder.  MSK: no tenderness along cervical spine, remains TTP soft tissue R side between spine and shoulder blade. Spurling test caused pt to have a one second episode of shaking of her shoulders, no exacerbation of arm symptoms, no radiculopathy with spurling test.  Assessment & Plan:  Jessica Ferguson was seen today for  follow-up arm pain and spasms.  Diagnoses and all orders for this visit:  Seizure-like activity (HCC) Discussed that at least some of the episodes don't sound like typical epileptiform seizures Pt and fiance are very concerned it might be seizures She does have some episodes at night when she is asleep that she doesn't remember but her fiance witnesses.  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 Rex Kras, MD [facility]

## 2016-07-01 DIAGNOSIS — M25511 Pain in right shoulder: Secondary | ICD-10-CM

## 2016-07-01 HISTORY — DX: Pain in right shoulder: M25.511

## 2016-07-20 ENCOUNTER — Ambulatory Visit (INDEPENDENT_AMBULATORY_CARE_PROVIDER_SITE_OTHER): Payer: Self-pay | Admitting: Diagnostic Neuroimaging

## 2016-07-20 ENCOUNTER — Encounter: Payer: Self-pay | Admitting: Diagnostic Neuroimaging

## 2016-07-20 VITALS — BP 114/76 | HR 72 | Ht 61.5 in | Wt 93.8 lb

## 2016-07-20 DIAGNOSIS — M62838 Other muscle spasm: Secondary | ICD-10-CM

## 2016-07-20 NOTE — Progress Notes (Signed)
[facility]  PATIENT: Jessica Ferguson DOB: 01-Mar-1991  REFERRING CLINICIAN: Laurena Bering Vincent HISTORY FROM: patient and husband REASON FOR VISIT: new consult    HISTORICAL  CHIEF COMPLAINT:  Chief Complaint  Patient presents with  . Other    rm 6, New pt, husband- Reuel BoomDaniel, "accident work related- hit in back b/tween shoulder blades by metal rack- did not fall or lose conscoiusness; ever since has headache goes to top of head right side, radiates to neck, down right arm to fingers; invontary episodes of shaking, husband witnessed episodes of seizre-like activity when she is not responsive but conscious"    HISTORY OF PRESENT ILLNESS:   25 year old right-handed female here for evaluation of abnormal involuntary movements and muscle spasm. 05/25/16 patient was at work when she was struck in the back between her shoulder blades by a metal rack. Patient felt immediate sharp pain between her shoulder blades. No loss of consciousness or confusion at that time. No head trauma. Over the next few days patient started having increasing pain. One night patient's husband accidentally brushes hand against the spot in her back where she was injured. The seventh a shock pain patient had involuntary generalized convulsions and tremors for 1-2 minutes. Patient was not responsive to his verbal commands. Since that time on a daily basis patient is having intermittent mild muscle spasms and tremors in her upper body, right arm and head. Patient has been using cyclobenzaprine without relief.  Patient was born full-term, no complication, with normal birth and development. No history of seizure. She does have family history of migraine. Patient herself does have migraine as well.   REVIEW OF SYSTEMS: Full 14 system review of systems performed and negative with exception of: Headache numbness slurred speech tremor decreased energy joint pain joint swelling aching muscles.  ALLERGIES: Allergies    Allergen Reactions  . Asa [Aspirin] Nausea And Vomiting    migraine    HOME MEDICATIONS: Outpatient Medications Prior to Visit  Medication Sig Dispense Refill  . cyclobenzaprine (FLEXERIL) 5 MG tablet Take 1 tablet (5 mg total) by mouth 3 (three) times daily as needed for muscle spasms. 30 tablet 1   No facility-administered medications prior to visit.     PAST MEDICAL HISTORY: Past Medical History:  Diagnosis Date  . Headache    migraines-"born with them"    PAST SURGICAL HISTORY: History reviewed. No pertinent surgical history.  FAMILY HISTORY: History reviewed. No pertinent family history.  SOCIAL HISTORY:  Social History   Social History  . Marital status: Married    Spouse name: Reuel BoomDaniel  . Number of children: 0  . Years of education: 12   Occupational History  .      [employer]   Social History Main Topics  . Smoking status: Never Smoker  . Smokeless tobacco: Never Used  . Alcohol use No     Comment: rare  . Drug use: No  . Sexual activity: Yes    Birth control/ protection: None   Other Topics Concern  . Not on file   Social History Narrative   Lives with husband   Caffeine- Mtn Dew 1 a day to prevent headache/migraine     PHYSICAL EXAM  GENERAL EXAM/CONSTITUTIONAL: Vitals:  Vitals:   07/20/16 1112  BP: 114/76  Pulse: 72  Weight: 93 lb 12.8 oz (42.5 kg)  Height: 5' 1.5" (1.562 m)     Body mass index is 17.44 kg/m.  Visual Acuity Screening   Right eye Left  eye Both eyes  Without correction: 20/30 20/30   With correction:        Patient is in no distress; well developed, nourished and groomed; neck is supple  CARDIOVASCULAR:  Examination of carotid arteries is normal; no carotid bruits  Regular rate and rhythm, no murmurs  Examination of peripheral vascular system by observation and palpation is normal  EYES:  Ophthalmoscopic exam of optic discs and posterior segments is normal; no papilledema or  hemorrhages  MUSCULOSKELETAL:  Gait, strength, tone, movements noted in Neurologic exam below  NEUROLOGIC: MENTAL STATUS:  No flowsheet data found.  awake, alert, oriented to person, place and time  recent and remote memory intact  normal attention and concentration  language fluent, comprehension intact, naming intact,   fund of knowledge appropriate  CRANIAL NERVE:   2nd - no papilledema on fundoscopic exam  2nd, 3rd, 4th, 6th - pupils equal and reactive to light, visual fields full to confrontation, extraocular muscles intact, no nystagmus  5th - facial sensation symmetric  7th - facial strength symmetric  8th - hearing intact  9th - palate elevates symmetrically, uvula midline  11th - shoulder shrug symmetric  12th - tongue protrusion midline  MOTOR:   normal bulk and tone, full strength in the BUE, BLE  RARE, INTERMITTENT ABNORMAL INVOLUNTARY MOVEMENTS (BRIEF MUSCLE JERKS OF UPPER BODY AND RIGHT ARM)  SENSORY:   normal and symmetric to light touch, pinprick, temperature, vibration  DECR TO ALL MODALITIES IN RIGHT NECK AND RIGHT ARM  COORDINATION:   finger-nose-finger, fine finger movements normal  REFLEXES:   deep tendon reflexes present and symmetric  GAIT/STATION:   narrow based gait; able to walk on toes, heels and tandem; romberg is negative  HOLDS RIGHT ARM FLEXED WHILE WALKING    DIAGNOSTIC DATA (LABS, IMAGING, TESTING) - I reviewed patient records, labs, notes, testing and imaging myself where available.  Lab Results  Component Value Date   WBC 8.4 11/01/2014   HGB 13.6 11/01/2014   HCT 42.7 11/01/2014   MCV 85.7 11/01/2014   No results found for: NA, K, CL, CO2, GLUCOSE, BUN, CREATININE, CALCIUM, PROT, ALBUMIN, AST, ALT, ALKPHOS, BILITOT, GFRNONAA, GFRAA No results found for: CHOL, HDL, LDLCALC, LDLDIRECT, TRIG, CHOLHDL No results found for: ZOXW9U No results found for: VITAMINB12 No results found for: TSH   05/28/16 right  shoulder xray [I reviewed images myself and agree with interpretation. -VRP]  - negative    ASSESSMENT AND PLAN  25 y.o. year old female here with new onset intermittent muscle spasms following injury where she was struck in the back by metal rack. Most likely represents muscular skeletal injury and associated muscle spasms. Epileptic seizure seems less likely.   Dx: muscle spasms (s/p accident/injury); seizure unlikely  1. Muscle spasm      PLAN: - recommend PT evaluation - consider MRI brain and EEG (not able to afford at this time due to lack of insurance and financial means)  Return if symptoms worsen or fail to improve, for return to PCP.    Suanne Marker, MD 07/20/2016, 12:06 PM Certified in Neurology, Neurophysiology and Neuroimaging  [facility] [address] [place], [place] [address] [phone]

## 2016-07-20 NOTE — Patient Instructions (Signed)
Thank you for coming to see [place] at [facility]. I hope we have been able to provide you high quality care today.  You may receive a patient satisfaction survey over the next few weeks. We would appreciate your feedback and comments so that we may continue to improve ourselves and the health of our patients.  - consider physical therapy  - consider MRI and EEG testing   ~~~~~~~~~~~~~~~~~~~~~~~~~~~~~~~~~~~~~~~~~~~~~~~~~~~~~~~~~~~~~~~~~  DR. Lalena Salas'S GUIDE TO HAPPY AND HEALTHY LIVING These are some of my general health and wellness recommendations. Some of them may apply to you better than others. Please use common sense as you try these suggestions and feel free to ask me any questions.   ACTIVITY/FITNESS Mental, social, emotional and physical stimulation are very important for brain and body health. Try learning a new activity (arts, music, language, sports, games).  Keep moving your body to the best of your abilities. You can do this at home, inside or outside, the park, community center, gym or anywhere you like. Consider a physical therapist or personal trainer to get started. Consider the app Sworkit. Fitness trackers such as smart-watches, smart-phones or Fitbits can help as well.   NUTRITION Eat more plants: colorful vegetables, nuts, seeds and berries.  Eat less sugar, salt, preservatives and processed foods.  Avoid toxins such as cigarettes and alcohol.  Drink water when you are thirsty. Warm water with a slice of lemon is an excellent morning drink to start the day.  Consider these websites for more information The Nutrition Source (https://www.henry-hernandez.biz/) [employer] Nutrition (WindowBlog.ch)   RELAXATION Consider practicing mindfulness meditation or other relaxation techniques such as deep breathing, prayer, yoga, tai chi, massage. See website mindful.org or the apps Headspace or Calm to help get  started.   SLEEP Try to get at least 7-8+ hours sleep per day. Regular exercise and reduced caffeine will help you sleep better. Practice good sleep hygeine techniques. See website sleep.org for more information.   PLANNING Prepare estate planning, living will, healthcare POA documents. Sometimes this is best planned with the help of an attorney. Theconversationproject.org and agingwithdignity.org are excellent resources.

## 2016-07-27 ENCOUNTER — Ambulatory Visit: Payer: Self-pay | Admitting: Pediatrics

## 2016-08-03 ENCOUNTER — Encounter: Payer: Self-pay | Admitting: Pediatrics

## 2016-08-03 ENCOUNTER — Ambulatory Visit (INDEPENDENT_AMBULATORY_CARE_PROVIDER_SITE_OTHER): Payer: Self-pay | Admitting: Pediatrics

## 2016-08-03 VITALS — BP 104/72 | HR 68 | Temp 97.1°F | Ht 61.5 in | Wt 96.4 lb

## 2016-08-03 DIAGNOSIS — M25511 Pain in right shoulder: Secondary | ICD-10-CM

## 2016-08-03 NOTE — Progress Notes (Signed)
  Subjective:   Patient ID: Jessica Ferguson, female    DOB: June 29, 1991, 25 y.o.   MRN: [id number] CC: Follow-up (shoulder pain, numbness in legs)  HPI: Jessica Ferguson is a 25 y.o. female presenting for Follow-up (shoulder pain, numbness in legs)  Pain in shoulder: taking 2 aleve in the morning, sometimes also motrin Ongoing pain in shoulder Doesn't think it is improving She is doing range of motion exercises Flexeril--taking at night Legs sometimes feel burning and swollen Still able to walk on legs when they feel numb   Relevant past medical, surgical, family and social history reviewed. Allergies and medications reviewed and updated. History  Smoking Status  . Never Smoker  Smokeless Tobacco  . Never Used   ROS: Per HPI   Objective:    BP 104/72   Pulse 68   Temp 97.1 F (36.2 C) (Oral)   Ht 5' 1.5" (1.562 m)   Wt 96 lb 6.4 oz (43.7 kg)   BMI 17.92 kg/m   Wt Readings from Last 3 Encounters:  08/03/16 96 lb 6.4 oz (43.7 kg)  07/20/16 93 lb 12.8 oz (42.5 kg)  06/29/16 97 lb (44 kg)    Gen: NAD, alert, cooperative with exam, NCAT EYES: EOMI, no conjunctival injection, or no icterus CV: NRRR, normal S1/S2, no murmur, distal pulses 2+ b/l Resp: CTABL, no wheezes, normal WOB Ext: No edema, warm Neuro: Alert and oriented MSK: skin TTP over back and on top of R shoulder Not able to raise R arm over 80 degrees. Int/ext shoulder rotation similar L and R. No redness, no swelling. Rotator cuff muslces intact, biceps intact  Assessment & Plan:  Jessica Ferguson was seen today for follow-up shoulder pain  Diagnoses and all orders for this visit:  Pain in joint of right shoulder Ongoing pain after injury Not able to get further imaging to evaluate burning and numbness due to lack of insurance Cont NSAIDs, flexeril as needed for muscle spasm if it is helping Cont ROM with shoulder Recommend PT, not able to afford right now  Follow up plan: prn Rex Krasarol Vincent, [facility]

## 2016-08-16 ENCOUNTER — Telehealth: Payer: Self-pay | Admitting: Pediatrics

## 2016-08-16 DIAGNOSIS — R29818 Other symptoms and signs involving the nervous system: Secondary | ICD-10-CM

## 2016-08-20 NOTE — Telephone Encounter (Signed)
Pt with ongoing R arm numbness, pains in R arm and L arm. Continues to have intermittent episodes of "losing control of her body" most recently two days ago. Seen by neurology, at that time pt didn't have funds for MRI but does want to go through with it now. Order placed.  Pt also mentioned her husband thinks she is feeling more depressed recently with current symptoms ongoing. Pt says she agrees with him that she has been more stressed, also eating more than usual. Encouraged pt to find counselor given possibly chronicity of ongoing symptoms that still do not have a diagnosis, come back to talk with us is worsening, can pick up counseling list of resources in area from our office as well. Will let us know if any worsening of symptoms. Discouraged driving if she is continuing to have episodes of losing control of her limbs.

## 2016-08-24 ENCOUNTER — Ambulatory Visit (HOSPITAL_COMMUNITY): Payer: Self-pay | Attending: Pediatrics

## 2016-08-29 ENCOUNTER — Telehealth: Payer: Self-pay | Admitting: Pediatrics

## 2016-08-30 ENCOUNTER — Telehealth: Payer: Self-pay | Admitting: Pediatrics

## 2016-08-30 NOTE — Telephone Encounter (Signed)
Left message with [employer] that we have no request on file and to please fax us another one.

## 2016-09-03 NOTE — Telephone Encounter (Signed)
A new request will be faxed over today

## 2016-09-07 ENCOUNTER — Ambulatory Visit (HOSPITAL_COMMUNITY)
Admission: RE | Admit: 2016-09-07 | Discharge: 2016-09-07 | Disposition: A | Discharge status: Home / Self Care | Payer: Medicaid Other | Source: Ambulatory Visit | Attending: Pediatrics | Admitting: Pediatrics

## 2016-09-07 DIAGNOSIS — R29818 Other symptoms and signs involving the nervous system: Secondary | ICD-10-CM | POA: Diagnosis not present

## 2016-09-07 MED ORDER — GADOBENATE DIMEGLUMINE 529 MG/ML IV SOLN
10.0000 mL | Freq: Once | INTRAVENOUS | Status: AC | PRN
  Administered 2016-09-07: 8 mL via INTRAVENOUS

## 2016-09-18 ENCOUNTER — Telehealth: Payer: Self-pay | Admitting: Pediatrics

## 2016-09-20 ENCOUNTER — Encounter: Payer: Self-pay | Admitting: Pediatrics

## 2016-09-20 ENCOUNTER — Ambulatory Visit (INDEPENDENT_AMBULATORY_CARE_PROVIDER_SITE_OTHER): Payer: Medicaid Other | Admitting: Pediatrics

## 2016-09-20 VITALS — BP 113/73 | HR 78 | Temp 98.3°F | Ht 61.5 in | Wt 99.0 lb

## 2016-09-20 DIAGNOSIS — M542 Cervicalgia: Secondary | ICD-10-CM

## 2016-09-20 DIAGNOSIS — M25511 Pain in right shoulder: Secondary | ICD-10-CM | POA: Diagnosis not present

## 2016-09-20 DIAGNOSIS — R413 Other amnesia: Secondary | ICD-10-CM | POA: Diagnosis not present

## 2016-09-20 DIAGNOSIS — M62838 Other muscle spasm: Secondary | ICD-10-CM | POA: Diagnosis not present

## 2016-09-20 DIAGNOSIS — G43809 Other migraine, not intractable, without status migrainosus: Secondary | ICD-10-CM | POA: Diagnosis not present

## 2016-09-20 MED ORDER — SUMATRIPTAN SUCCINATE 100 MG PO TABS: 100.0000 mg | ORAL_TABLET | ORAL | 2 refills | Status: DC | PRN

## 2016-09-20 NOTE — Progress Notes (Addendum)
Subjective:   Patient ID: Jessica Ferguson, female    DOB: [birth year], 25 y.o.   MRN: [id number] CC: Discuss MRI results  HPI: Jessica Drownerri E Vanevery is a 25 y.o. 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 Otherwise normal.   Says she has been having pain in the back of her head for the past two months in addition to ongoing symptoms of R sided neck pain, b/l shoulder pain, R arm pain, numbness and pain in both arms Has not mentioned pain in back of head in previous office visits she says because she didn't think it was connected to other symptoms Says now pain is hurting most where the spine connects to the skull When that pain happens it often turns into migraines that are focused in the back of her head. HA usually last 2-3 days + Photophobia. No nausea someitmes has burning "all over the base of her skull" Aspirin and celery triggered headaches in the past Had some celery this past weekend, had a headache afterward  Arm continues to hurt, primarily R arm Elbow hurts, feels swollen at times, says sometimes her rings are too small to get on, thinks her fingers go up two ring sizes Says this happens usually every day Usually R arm/hand that swells, sometimes other arm Starting to get motion and movement back in her arm Still not normal Still has intermittent numbness in her arms b/l, will come on out of the blue, such as when just sitting still, lasts for a few minutes to several hours  Says flexeril is not helping much with the pain  Both shoulders and R side of neck continue to hurt in addition to other symptoms above Sometimes takes 2 motrin at a time, helps some, nothing takes pain away  Relevant past medical, surgical, family and social history reviewed. Allergies and medications reviewed and updated. History  Smoking Status  . Never Smoker    Smokeless Tobacco  . Never Used   ROS: Per HPI   Objective:    BP 113/73   Pulse 78   Temp 98.3 F (36.8 C) (Oral)   Ht 5' 1.5" (1.562 m)   Wt 99 lb (44.9 kg)   BMI 18.40 kg/m   Wt Readings from Last 3 Encounters:  09/20/16 99 lb (44.9 kg)  08/03/16 96 lb 6.4 oz (43.7 kg)  07/20/16 93 lb 12.8 oz (42.5 kg)    Gen: NAD, alert, cooperative with exam, NCAT EYES: EOMI, no conjunctival injection, or no icterus ENT:  TMs pearly gray b/l, OP without erythema LYMPH: no cervical LAD CV: NRRR, normal S1/S2, no murmur, distal pulses 2+ b/l Resp: CTABL, no wheezes, normal WOB Ext: No edema in hands/arms, warm Neuro: Alert and oriented, strength equal b/l UE with shoulder ext, abduction, hand grip, elbow flex/ext, shaky finger nose finger b/l, smoother L side than R MSK: fairly normal ROM neck turning head to the L and R, some tightness still R side of neck, says no sensation to touch in arms b/l, does have some pain in R elbow, R distal posterior upper arm Skin: very sensitive to palpation throughout spine, not able to palpate spine itself due to discomfort with slight touch to skin  Assessment & Plan:  [name] was seen today for discuss mri results, follow up neck, shoulder pain.  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 Discussed with radiologist on call. 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 Nothing on scan that explains current symptoms. Not sure cause of memory problems. No worsening of symptoms since last visit.  Neck pain Muscle spasm Pain in joint of right shoulder For ongoing neck, shoulder pains, limited options with lack of insurance. Recommend PT evaluation. Pt open to idea, has someone in mind she is going to contact. Not able to get MRI of cervical spine at this time due to lack of insurance. Symptoms no worse, slow to improve. Cont heat, ice as needed Ibuprofen as  needed  Other migraine without status migrainosus, not intractable Continued symptoms, treat with below.  -     SUMAtriptan (IMITREX) 100 MG tablet; Take 1 tablet (100 mg total) by mouth every 2 (two) hours as needed for migraine. May repeat in 2 hours if headache persists or recurs.  Muscle spasm Cont muscle relaxer prn  Follow up plan: 3 mo Rex Krasarol Lamaya Hyneman, MD [facility]

## 2016-09-21 DIAGNOSIS — G43909 Migraine, unspecified, not intractable, without status migrainosus: Secondary | ICD-10-CM | POA: Insufficient documentation

## 2016-09-21 DIAGNOSIS — M62838 Other muscle spasm: Secondary | ICD-10-CM | POA: Insufficient documentation

## 2016-09-23 DIAGNOSIS — R413 Other amnesia: Secondary | ICD-10-CM | POA: Insufficient documentation

## 2016-09-23 DIAGNOSIS — M542 Cervicalgia: Secondary | ICD-10-CM | POA: Insufficient documentation

## 2016-10-09 ENCOUNTER — Ambulatory Visit (INDEPENDENT_AMBULATORY_CARE_PROVIDER_SITE_OTHER): Payer: Self-pay | Admitting: Pediatrics

## 2016-10-09 ENCOUNTER — Encounter: Payer: Self-pay | Admitting: Pediatrics

## 2016-10-09 VITALS — BP 115/79 | HR 91 | Temp 97.7°F | Ht 61.5 in | Wt 96.4 lb

## 2016-10-09 DIAGNOSIS — N926 Irregular menstruation, unspecified: Secondary | ICD-10-CM

## 2016-10-09 DIAGNOSIS — Z3201 Encounter for pregnancy test, result positive: Secondary | ICD-10-CM

## 2016-10-09 DIAGNOSIS — O219 Vomiting of pregnancy, unspecified: Secondary | ICD-10-CM

## 2016-10-09 LAB — PREGNANCY, URINE: PREG TEST UR: POSITIVE — AB

## 2016-10-09 MED ORDER — DOXYLAMINE-PYRIDOXINE 10-10 MG PO TBEC: 1.0000 mg | DELAYED_RELEASE_TABLET | Freq: Every evening | ORAL | 0 refills | Status: DC | PRN

## 2016-10-09 NOTE — Progress Notes (Signed)
  Subjective:   Patient ID: Jessica Ferguson, female    DOB: [birth year], 25 y.o.   MRN: [id number] CC: Nausea and Emesis  HPI: Jessica Ferguson is a 25 y.o. female presenting for Nausea and Emesis  Can keep some fluids down Has constant nausea morning to night for past week Doesn't matter if she eats Two positive pregnancy tests at home Stopped flexeril and sumatriptan  Nov 6-9 was last period, slightly lighter than usual Says last normal period was august   Relevant past medical, surgical, family and social history reviewed. Allergies and medications reviewed and updated. History  Smoking Status  . Never Smoker  Smokeless Tobacco  . Never Used   ROS: Per HPI   Objective:    BP 115/79   Pulse 91   Temp 97.7 F (36.5 C) (Oral)   Ht 5' 1.5" (1.562 m)   Wt 96 lb 6.4 oz (43.7 kg)   LMP 08/27/2016   BMI 17.92 kg/m   Wt Readings from Last 3 Encounters:  10/09/16 96 lb 6.4 oz (43.7 kg)  09/20/16 99 lb (44.9 kg)  08/03/16 96 lb 6.4 oz (43.7 kg)    Gen: NAD, alert, cooperative with exam, NCAT EYES: EOMI, no conjunctival injection, or no icterus ENT:  MMM, OP without erythema LYMPH: no cervical LAD CV: NRRR, normal S1/S2, no murmur, distal pulses 2+ b/l Resp: CTABL, no wheezes, normal WOB Abd: +BS, soft, NTND. no guarding or organomegaly Ext: No edema, warm Neuro: Alert and oriented  Assessment & Plan:  Jessica Ferguson was seen today for nausea and emesis.  Diagnoses and all orders for this visit:  Missed period Positive urine preg test Will send Beta HCG, refer to OB -     Pregnancy, urine  Nausea and vomiting during pregnancy Well hydrated on exam today Discussed conservative measures, hand out given Start below if needed, can buy them separately OTC Ask pharmacist for assistance if needed -     Doxylamine-Pyridoxine 10-10 MG TBEC; Take 1 mg by mouth at bedtime as needed. Can increase to morning and night  Positive pregnancy test -     Beta HCG, Quant   Follow up  plan: As needed Rex Krasarol Burnis Kaser, MD [facility]

## 2016-10-09 NOTE — Patient Instructions (Addendum)
Eat small amounts of food every one to two hours to avoid an empty or full stomach. It can be helpful to eliminate spicy, odorous, high-fat, acidic, and very sweet foods, and substitute protein-dominant, salty, low-fat, bland, and/or dry foods. Fluids should be consumed at least 30 minutes before or after solid food to minimize the effect of a full stomach. Fluids are better tolerated if cold, clear, and carbonated or sour. Avoid lying down after eating. Take small frequent sips of fluid.  Avoid triggers of nausea, for some people this can include stuffy rooms, odors, heat, humidity, noise, visual or physical motion, and gastric irritants (eg, coffee, iron supplements).

## 2016-10-10 LAB — BETA HCG QUANT (REF LAB): HCG QUANT: 37074 m[IU]/mL

## 2016-10-11 ENCOUNTER — Telehealth: Payer: Self-pay | Admitting: Pediatrics

## 2016-10-11 DIAGNOSIS — R112 Nausea with vomiting, unspecified: Secondary | ICD-10-CM

## 2016-10-11 MED ORDER — VITAMIN B-6 25 MG PO TABS: 25.0000 mg | ORAL_TABLET | Freq: Every evening | ORAL | 0 refills | Status: DC | PRN

## 2016-10-11 NOTE — Telephone Encounter (Signed)
Pt notified of recommendation Verbalizes understanding 

## 2016-10-11 NOTE — Addendum Note (Signed)
Addended by: Johna SheriffVINCENT, Barb Shear L on: 10/11/2016 08:05 AM   Modules accepted: Orders

## 2016-10-11 NOTE — Telephone Encounter (Signed)
She can try OTC pyridoxine, vitamin B6 25mg  once a day Continue to avoid triggers like we discussed in clinic. Keep trying small sips of fluid to stay hydrated If the vit B6 doesn't help can try OTC diphenhydramine or benadryl 25mg  q6h to help. [facility] should hear soon about appt.

## 2016-11-05 ENCOUNTER — Ambulatory Visit: Payer: Self-pay | Admitting: Pediatrics

## 2016-12-01 ENCOUNTER — Other Ambulatory Visit: Payer: Self-pay | Admitting: Pediatrics

## 2016-12-01 DIAGNOSIS — R112 Nausea with vomiting, unspecified: Secondary | ICD-10-CM

## 2016-12-12 ENCOUNTER — Telehealth: Payer: Self-pay

## 2016-12-12 NOTE — Telephone Encounter (Signed)
I wrote for it 12/19 and it was too expensive, so yes now likely coming from OB. If she is still having morning sickness she should discuss with her OB. She hadnt gotten in to see OB when I saw her.

## 2016-12-24 ENCOUNTER — Ambulatory Visit: Payer: Self-pay | Admitting: Pediatrics

## 2016-12-25 ENCOUNTER — Encounter: Payer: Self-pay | Admitting: Pediatrics

## 2017-03-29 IMAGING — DX DG SHOULDER 2+V*R*
3 series · 3 of 3 positions shown · non-contrast
Comparison: None.

CLINICAL DATA: Right shoulder pain.

EXAM:
RIGHT SHOULDER - 2+ VIEW

[shoulder ap]
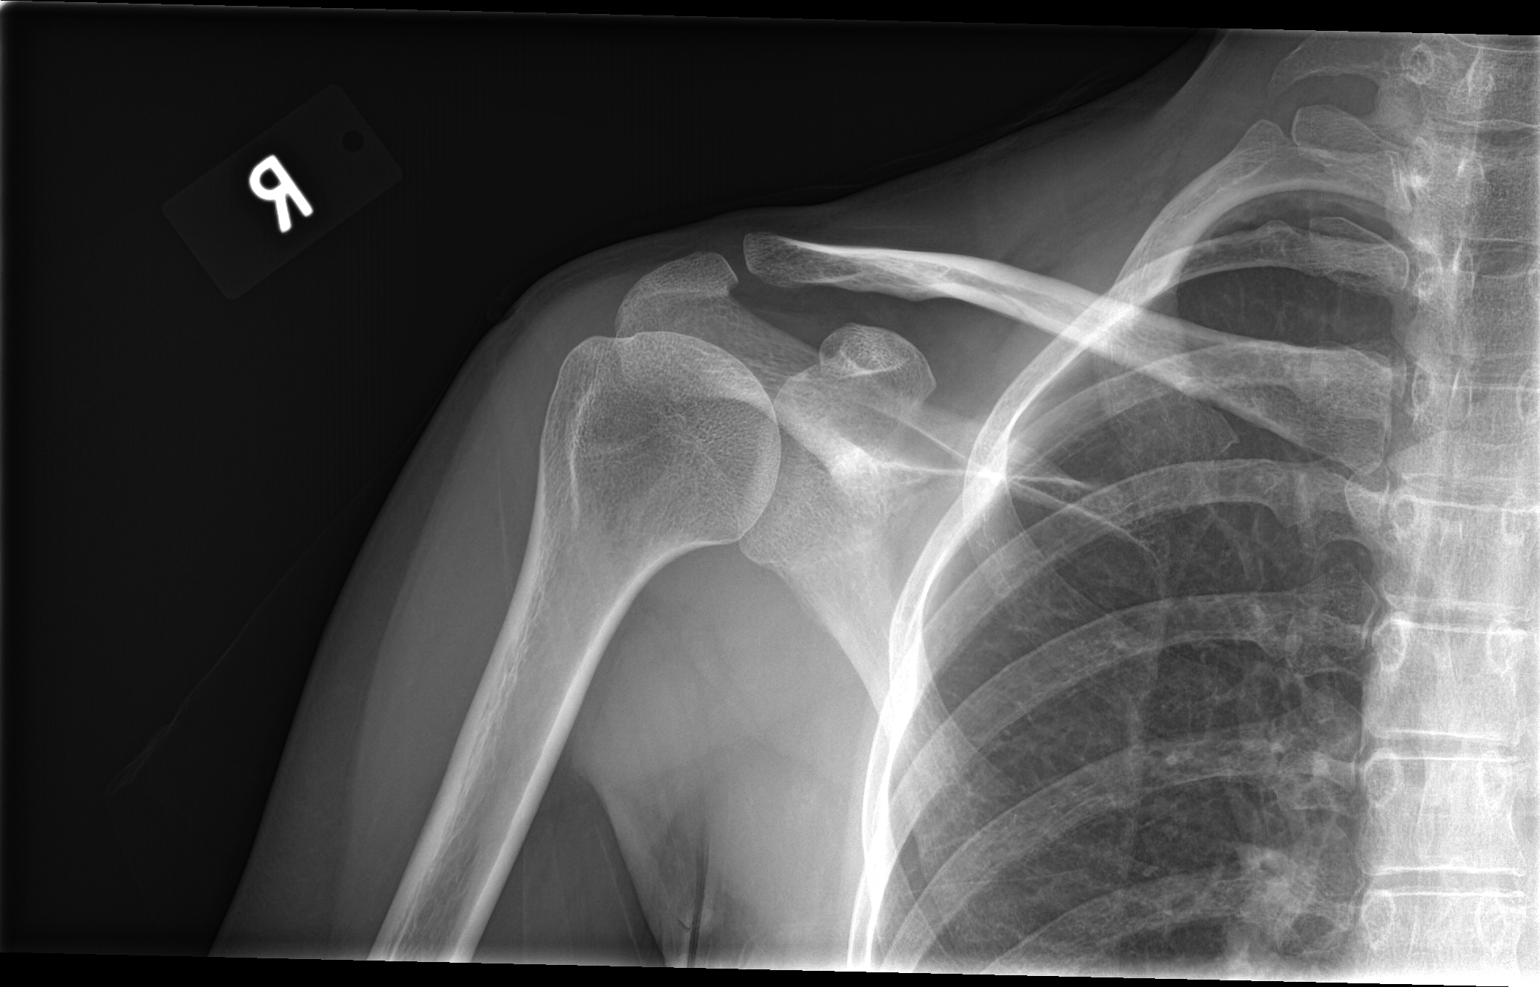

[shoulder obl]
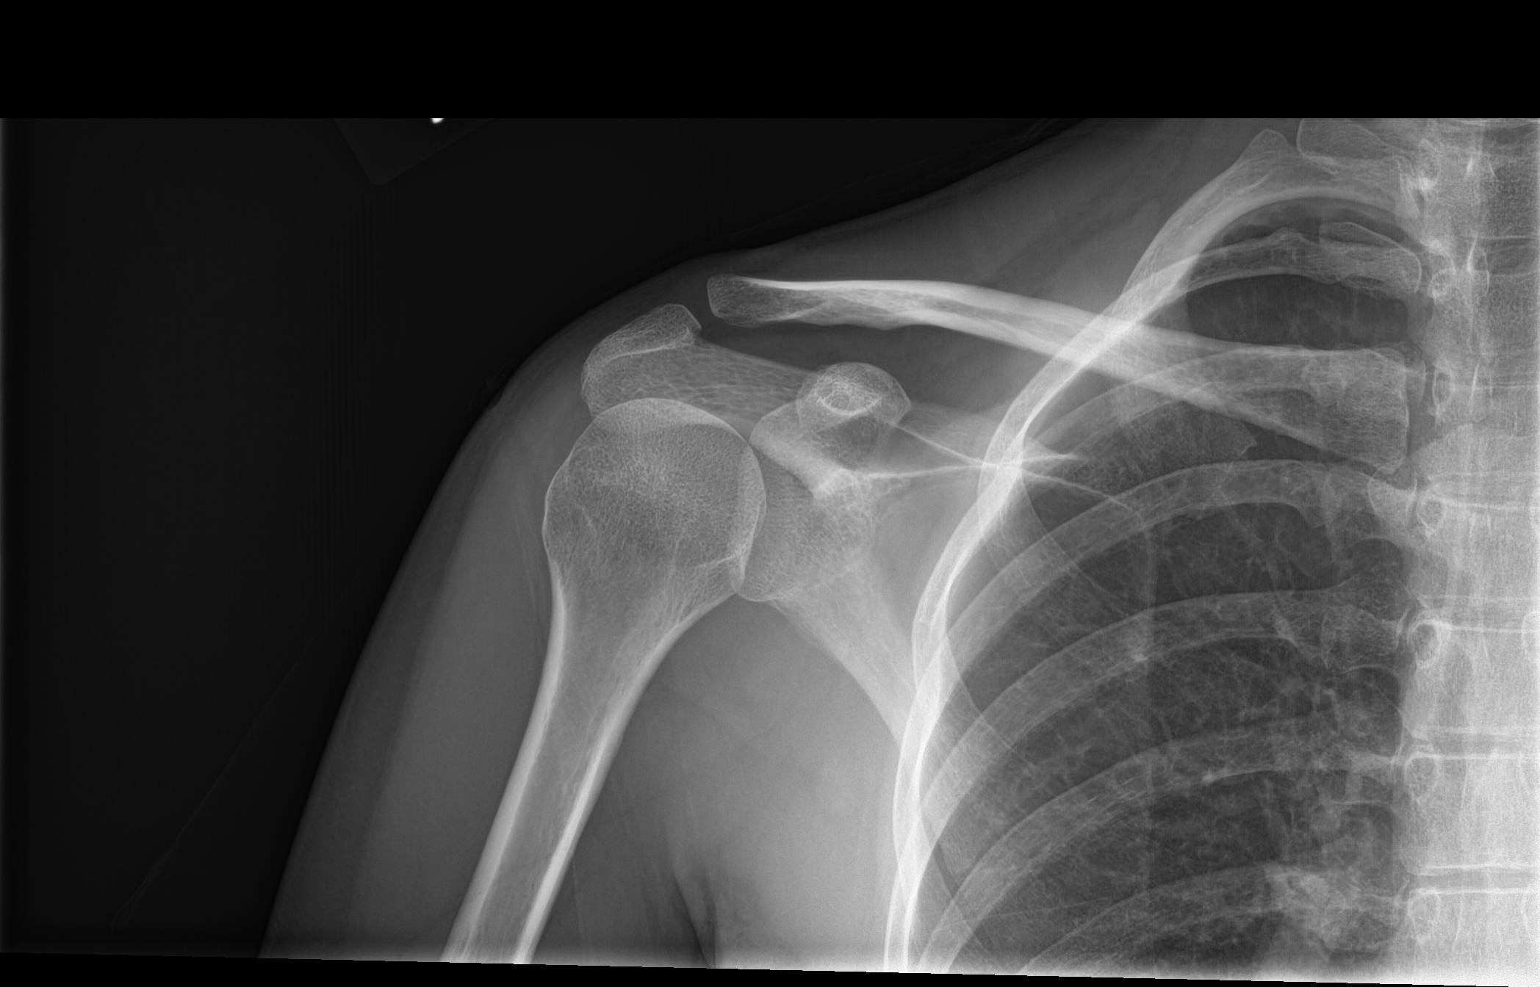

[shoulder axial]
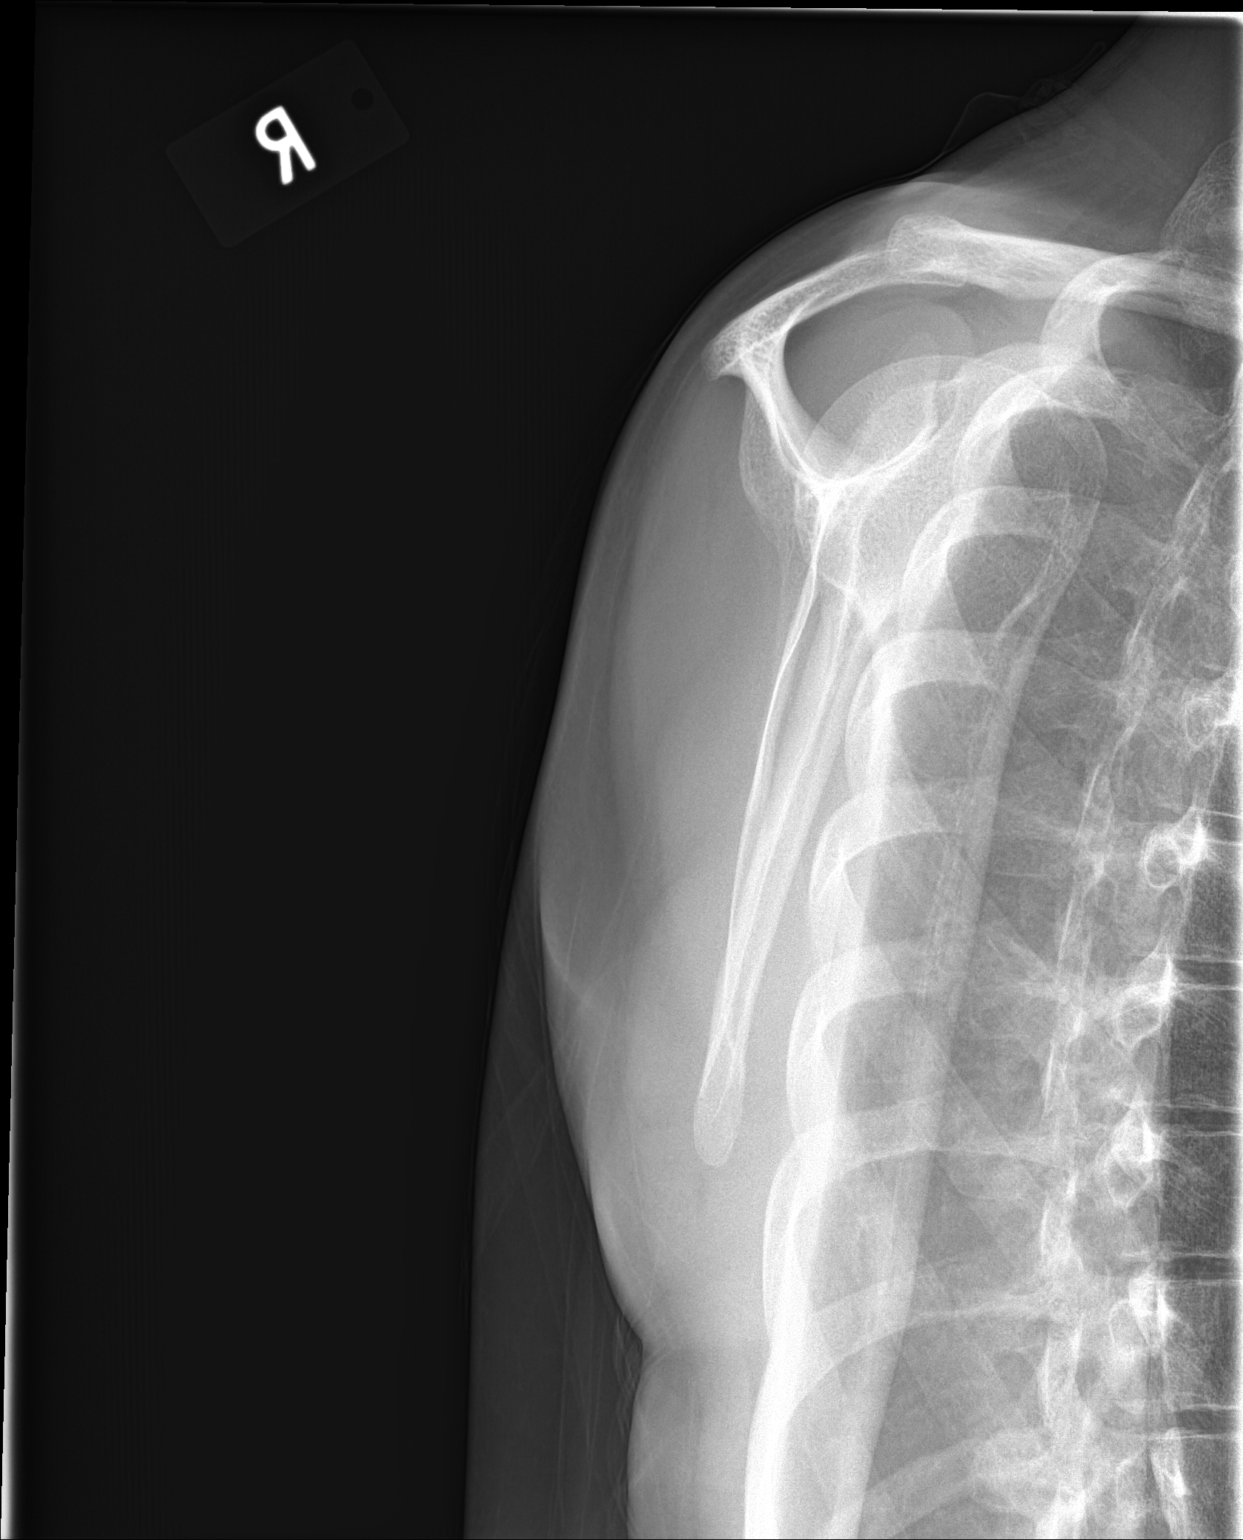

[3 of 3 positions shown; findings below may reference images not displayed]

FINDINGS: There is no evidence of fracture or dislocation. There is no
evidence of arthropathy or other focal bone abnormality. Soft
tissues are unremarkable.
IMPRESSION: Negative.

## 2017-05-31 ENCOUNTER — Ambulatory Visit: Payer: Self-pay | Admitting: Family

## 2017-07-30 ENCOUNTER — Ambulatory Visit (INDEPENDENT_AMBULATORY_CARE_PROVIDER_SITE_OTHER): Payer: Medicaid Other | Admitting: Family Medicine

## 2017-07-30 ENCOUNTER — Encounter: Payer: Self-pay | Admitting: Family Medicine

## 2017-07-30 VITALS — BP 111/76 | HR 79 | Temp 98.2°F | Ht 61.5 in | Wt 94.2 lb

## 2017-07-30 DIAGNOSIS — J0140 Acute pansinusitis, unspecified: Secondary | ICD-10-CM | POA: Diagnosis not present

## 2017-07-30 MED ORDER — AMOXICILLIN-POT CLAVULANATE 875-125 MG PO TABS: 1.0000 | ORAL_TABLET | Freq: Two times a day (BID) | ORAL | 0 refills | Status: DC

## 2017-07-30 NOTE — Progress Notes (Signed)
   HPI  Patient presents today here with concern for sinus infection.  Patient reports similar symptoms previously infections. She's had one days onset of facial pain, congestion, frontal headache, and bilateral ear pain with normal hearing.  She's tolerating food and fluids like usual.  She has a 42-month-old son and she is breast-feeding  PMH: Smoking status noted ROS: Per HPI  Objective: BP 111/76   Pulse 79   Temp 98.2 F (36.8 C) (Oral)   Ht 5' 1.5" (1.562 m)   Wt 94 lb 3.2 oz (42.7 kg)   BMI 17.51 kg/m  Gen: NAD, alert, cooperative with exam HEENT: NCAT, right TM obscured by cerumen, left TM within normal limits, tenderness to palpation throughout bilateral maxillary and frontal sinuses, oropharynx moist and clear CV: RRR, good S1/S2, no murmur Resp: CTABL, no wheezes, non-labored Ext: No edema, warm Neuro: Alert and oriented, No gross deficits  Assessment and plan:  # Pansinusitis Treat with Augmentin, she is breast-feeding which is acceptable with augmentin.  RTC with any concerns    Meds ordered this encounter  Medications  . medroxyPROGESTERone (DEPO-PROVERA) 150 MG/ML injection    Sig: Inject 150 mg into the muscle every 3 (three) months.  Marland Kitchen amoxicillin-clavulanate (AUGMENTIN) 875-125 MG tablet    Sig: Take 1 tablet by mouth 2 (two) times daily.    Dispense:  20 tablet    Refill:  0    Murtis Sink, MD [facility] 07/30/2017, 10:48 AM

## 2017-07-30 NOTE — Patient Instructions (Signed)
Great to see you!   Sinusitis, Adult Sinusitis is soreness and inflammation of your sinuses. Sinuses are hollow spaces in the bones around your face. They are located:  Around your eyes.  In the middle of your forehead.  Behind your nose.  In your cheekbones.  Your sinuses and nasal passages are lined with a stringy fluid (mucus). Mucus normally drains out of your sinuses. When your nasal tissues get inflamed or swollen, the mucus can get trapped or blocked so air cannot flow through your sinuses. This lets bacteria, viruses, and funguses grow, and that leads to infection. Follow these instructions at home: Medicines  Take, use, or apply over-the-counter and prescription medicines only as told by your doctor. These may include nasal sprays.  If you were prescribed an antibiotic medicine, take it as told by your doctor. Do not stop taking the antibiotic even if you start to feel better. Hydrate and Humidify  Drink enough water to keep your pee (urine) clear or pale yellow.  Use a cool mist humidifier to keep the humidity level in your home above 50%.  Breathe in steam for 10-15 minutes, 3-4 times a day or as told by your doctor. You can do this in the bathroom while a hot shower is running.  Try not to spend time in cool or dry air. Rest  Rest as much as possible.  Sleep with your head raised (elevated).  Make sure to get enough sleep each night. General instructions  Put a warm, moist washcloth on your face 3-4 times a day or as told by your doctor. This will help with discomfort.  Wash your hands often with soap and water. If there is no soap and water, use hand sanitizer.  Do not smoke. Avoid being around people who are smoking (secondhand smoke).  Keep all follow-up visits as told by your doctor. This is important. Contact a doctor if:  You have a fever.  Your symptoms get worse.  Your symptoms do not get better within 10 days. Get help right away if:  You  have a very bad headache.  You cannot stop throwing up (vomiting).  You have pain or swelling around your face or eyes.  You have trouble seeing.  You feel confused.  Your neck is stiff.  You have trouble breathing. This information is not intended to replace advice given to you by your health care provider. Make sure you discuss any questions you have with your health care provider. Document Released: 03/26/2008 Document Revised: 06/03/2016 Document Reviewed: 08/03/2015 Elsevier Interactive Patient Education  2018 [employer] Inc[employer]  

## 2017-11-28 ENCOUNTER — Encounter: Payer: Self-pay | Admitting: Family

## 2017-11-28 ENCOUNTER — Ambulatory Visit: Payer: Medicaid Other | Admitting: Family

## 2017-11-28 VITALS — BP 132/87 | HR 83 | Temp 99.5°F | Ht 61.5 in | Wt 94.2 lb

## 2017-11-28 DIAGNOSIS — J209 Acute bronchitis, unspecified: Secondary | ICD-10-CM | POA: Diagnosis not present

## 2017-11-28 MED ORDER — PREDNISONE 10 MG (21) PO TBPK: ORAL_TABLET | ORAL | 0 refills | Status: DC

## 2017-11-28 NOTE — Patient Instructions (Signed)

## 2017-11-28 NOTE — Progress Notes (Signed)
   Subjective:    Patient ID: Jessica Ferguson, female    DOB: [birth year], 27 y.o.   MRN: [id number]  Cough  This is a new problem. The current episode started in the past 7 days. The problem has been gradually worsening. The problem occurs every few minutes. The cough is non-productive. Associated symptoms include ear pain, a fever (99), myalgias, nasal congestion, rhinorrhea and wheezing. Pertinent negatives include no chills, headaches or sore throat. The symptoms are aggravated by lying down. She has tried rest and OTC cough suppressant for the symptoms. The treatment provided mild relief. There is no history of asthma.      Review of Systems  Constitutional: Positive for fever (99). Negative for chills.  HENT: Positive for ear pain and rhinorrhea. Negative for sore throat.   Respiratory: Positive for cough and wheezing.   Musculoskeletal: Positive for myalgias.  Neurological: Negative for headaches.  All other systems reviewed and are negative.      Objective:   Physical Exam  Constitutional: She is oriented to person, place, and time. She appears well-developed and well-nourished. No distress.  HENT:  Head: Normocephalic and atraumatic.  Right Ear: There is tenderness. A middle ear effusion is present.  Left Ear: There is tenderness.  Nose: Mucosal edema and rhinorrhea present.  Mouth/Throat: Posterior oropharyngeal erythema present.  Eyes: Pupils are equal, round, and reactive to light.  Neck: Normal range of motion. Neck supple. No thyromegaly present.  Cardiovascular: Normal rate, regular rhythm, normal heart sounds and intact distal pulses.  No murmur heard. Pulmonary/Chest: Effort normal and breath sounds normal. No respiratory distress. She has no wheezes.  Abdominal: Soft. Bowel sounds are normal. She exhibits no distension. There is no tenderness.  Musculoskeletal: Normal range of motion. She exhibits no edema or tenderness.  Neurological: She is alert and oriented to  person, place, and time.  Skin: Skin is warm and dry.  Psychiatric: She has a normal mood and affect. Her behavior is normal. Judgment and thought content normal.  Vitals reviewed.     BP 132/87   Pulse 83   Temp 99.5 F (37.5 C) (Oral)   Ht 5' 1.5" (1.562 m)   Wt 94 lb 3.2 oz (42.7 kg)   BMI 17.51 kg/m      Assessment & Plan:  1. Acute bronchitis, unspecified organism - Take meds as prescribed - Use a cool mist humidifier  -Force fluids -For any cough or congestion  Use plain Mucinex- regular strength or max strength is fine -For fever or aces or pains- take tylenol or ibuprofen appropriate for age and weight. -Throat lozenges if help -New toothbrush in 3 days - predniSONE (STERAPRED UNI-PAK 21 TAB) 10 MG (21) TBPK tablet; Use as directed  Dispense: 21 tablet; Refill: 0    Jannifer Rodneyhristy Quentez Lober, FNP

## 2018-06-27 ENCOUNTER — Telehealth: Payer: Self-pay | Admitting: Pediatrics

## 2018-06-27 NOTE — Telephone Encounter (Signed)
Patient aware, she does not want to go on any birth control right now, is trying to get pregnant.

## 2018-06-27 NOTE — Telephone Encounter (Signed)
Yes, it can be normal to have some spotting after depo-provera. Does she want to restart depo shots? We can do it here or she can go through OB/gyn. Would need office visit to restart here. If trying to avoid pregnancy should use back up/barrier/condoms in meantime.

## 2018-06-27 NOTE — Telephone Encounter (Signed)
Patient had last depo-provera shot in may, was supposed to have next shot in July; had period the end of August and is still having some spotting now.  Wondering if this is normal.

## 2018-07-28 ENCOUNTER — Ambulatory Visit: Payer: Medicaid Other | Admitting: Nurse Practitioner

## 2018-07-28 ENCOUNTER — Encounter: Payer: Self-pay | Admitting: Nurse Practitioner

## 2018-07-28 VITALS — BP 113/71 | HR 74 | Temp 99.1°F | Ht 61.5 in | Wt 98.6 lb

## 2018-07-28 DIAGNOSIS — R05 Cough: Secondary | ICD-10-CM

## 2018-07-28 DIAGNOSIS — R059 Cough, unspecified: Secondary | ICD-10-CM

## 2018-07-28 MED ORDER — BENZONATATE 100 MG PO CAPS: 100.0000 mg | ORAL_CAPSULE | Freq: Three times a day (TID) | ORAL | 0 refills | Status: DC | PRN

## 2018-07-28 NOTE — Patient Instructions (Signed)

## 2018-07-28 NOTE — Progress Notes (Signed)
   Subjective:    Patient ID: Jessica Ferguson, female    DOB: 03-05-1991, 27 y.o.   MRN: [id number]   Chief Complaint: Cough   HPI Patient comes in today c/o cough. No congestion. Slight sore throat. She has tried OTC cough meds.   Review of Systems  Constitutional: Negative for chills and fever.  HENT: Positive for sore throat. Negative for congestion, ear pain and trouble swallowing.   Respiratory: Positive for cough. Negative for shortness of breath.   Cardiovascular: Negative.   Gastrointestinal: Negative.   Genitourinary: Negative.   Neurological: Negative.   Psychiatric/Behavioral: Negative.   All other systems reviewed and are negative.      Objective:   Physical Exam  Constitutional: She is oriented to person, place, and time. She appears well-developed and well-nourished. No distress.  HENT:  Right Ear: Hearing, tympanic membrane, external ear and ear canal normal.  Left Ear: Hearing, tympanic membrane, external ear and ear canal normal.  Nose: Nose normal. Right sinus exhibits no maxillary sinus tenderness and no frontal sinus tenderness. Left sinus exhibits no maxillary sinus tenderness and no frontal sinus tenderness.  Mouth/Throat: Uvula is midline, oropharynx is clear and moist and mucous membranes are normal.  Cardiovascular: Normal rate and regular rhythm.  Pulmonary/Chest: Effort normal and breath sounds normal. She has no wheezes. She has no rales.  Neurological: She is alert and oriented to person, place, and time.  Skin: Skin is warm and dry. Capillary refill takes less than 2 seconds.  Psychiatric: She has a normal mood and affect. Her behavior is normal. Thought content normal.   BP 113/71   Pulse 74   Temp 99.1 F (37.3 C)   Ht 5' 1.5" (1.562 m)   Wt 98 lb 9.6 oz (44.7 kg)   BMI 18.33 kg/m       Assessment & Plan:  Jessica Ferguson in today with chief complaint of Cough   1. Cough 1. Take meds as prescribed 2. Use a cool mist humidifier  especially during the winter months and when heat has been humid. 3. Use saline nose sprays frequently 4. Saline irrigations of the nose can be very helpful if done frequently.  * 4X daily for 1 week*  * Use of a nettie pot can be helpful with this. Follow directions with this* 5. Drink plenty of fluids 6. Keep thermostat turn down low 7.For any cough or congestion  Use plain Mucinex- regular strength or max strength is fine   * Children- consult with Pharmacist for dosing 8. For fever or aces or pains- take tylenol or ibuprofen appropriate for age and weight.  * for fevers greater than 101 orally you may alternate ibuprofen and tylenol every  3 hours.    - benzonatate (TESSALON PERLES) 100 MG capsule; Take 1 capsule (100 mg total) by mouth 3 (three) times daily as needed for cough.  Dispense: 20 capsule; Refill: 0  Mary-Margaret Daphine Deutscher, FNP

## 2019-01-14 ENCOUNTER — Other Ambulatory Visit: Payer: Self-pay

## 2019-01-14 ENCOUNTER — Telehealth (INDEPENDENT_AMBULATORY_CARE_PROVIDER_SITE_OTHER): Payer: Medicaid Other | Admitting: Family Medicine

## 2019-01-14 DIAGNOSIS — J209 Acute bronchitis, unspecified: Secondary | ICD-10-CM | POA: Diagnosis not present

## 2019-01-14 MED ORDER — ALBUTEROL SULFATE HFA 108 (90 BASE) MCG/ACT IN AERS: 2.0000 | INHALATION_SPRAY | Freq: Four times a day (QID) | RESPIRATORY_TRACT | 0 refills | Status: DC | PRN

## 2019-01-14 MED ORDER — IPRATROPIUM BROMIDE 0.06 % NA SOLN: 2.0000 | Freq: Three times a day (TID) | NASAL | 2 refills | Status: DC

## 2019-01-14 MED ORDER — PREDNISONE 10 MG (21) PO TBPK: ORAL_TABLET | ORAL | 0 refills | Status: DC

## 2019-01-14 MED ORDER — AMOXICILLIN-POT CLAVULANATE 875-125 MG PO TABS: 1.0000 | ORAL_TABLET | Freq: Two times a day (BID) | ORAL | 0 refills | Status: DC

## 2019-01-14 NOTE — Progress Notes (Signed)
Telephone visit  Subjective: CC: cough PCP: Johna Sheriff, MD (Inactive) [id number] Jessica Ferguson is a 28 y.o. female calls for telephone consult today. Patient provides verbal consent for consult held via phone.  Location of patient: home Location of provider: [facility] Others present for call: son (1 year)  1. Bronchitis Patient reports onset of productive cough last Monday.  She reports that sputum is clear.  She reports a sensation of shortness of breath, particularly with physical activity.  She reports associated headache.  She notes that she has this every year and is treated for bronchitis with the steroids and other medications.  She denies any hemoptysis, fevers, chills.  She has been using old albuterol which has not been especially helpful and over-the-counter cough and cold medications.  She has been using elderberry and essential oils in efforts to get better.  She does note that the symptoms did seem to improve some but then suddenly got worse over the last 2 days.  No known contact with COVID-19.  She has been maintaining excellent hand hygiene practices.  She reports a remote history of asthma as a child but thought that she grew out of it.  She is due for her menstrual period in about 1 week and states that she is currently trying to become pregnant.   ROS: Per HPI  Allergies  Allergen Reactions  . Celery Oil     migraine  . Asa [Aspirin] Nausea And Vomiting    migraine   Past Medical History:  Diagnosis Date  . Headache    migraines-"born with them"    Current Outpatient Medications:  .  albuterol (PROVENTIL HFA;VENTOLIN HFA) 108 (90 Base) MCG/ACT inhaler, Inhale 2 puffs into the lungs every 6 (six) hours as needed for wheezing or shortness of breath., Disp: 1 Inhaler, Rfl: 0 .  ipratropium (ATROVENT) 0.06 % nasal spray, Place 2 sprays into both nostrils 3 (three) times daily., Disp: 15 mL, Rfl: 2   Assessment/ Plan: 28 y.o. female   1. Acute bronchitis, unspecified  organism Going to empirically treat as acute bacterial bronchitis versus sinusitis given ongoing symptoms and "second sickening".  Steroid Dosepak sent to pharmacy.  I have selected Augmentin because she is actively trying to get pregnant.  Albuterol inhaler sent.  I have renewed her Atrovent nasal spray.  We discussed reasons for reevaluation and emergent evaluation.  She voiced good understanding will follow-up PRN. - predniSONE (STERAPRED UNI-PAK 21 TAB) 10 MG (21) TBPK tablet; Use as directed  Dispense: 21 tablet; Refill: 0 - amoxicillin-clavulanate (AUGMENTIN) 875-125 MG tablet; Take 1 tablet by mouth 2 (two) times daily.  Dispense: 20 tablet; Refill: 0 - albuterol (PROVENTIL HFA;VENTOLIN HFA) 108 (90 Base) MCG/ACT inhaler; Inhale 2 puffs into the lungs every 6 (six) hours as needed for wheezing or shortness of breath.  Dispense: 1 Inhaler; Refill: 0   Start time: 1:23pm End time: 1:34pm  Meds ordered this encounter  Medications  . predniSONE (STERAPRED UNI-PAK 21 TAB) 10 MG (21) TBPK tablet    Sig: Use as directed    Dispense:  21 tablet    Refill:  0  . amoxicillin-clavulanate (AUGMENTIN) 875-125 MG tablet    Sig: Take 1 tablet by mouth 2 (two) times daily.    Dispense:  20 tablet    Refill:  0  . albuterol (PROVENTIL HFA;VENTOLIN HFA) 108 (90 Base) MCG/ACT inhaler    Sig: Inhale 2 puffs into the lungs every 6 (six) hours as needed for wheezing or shortness  of breath.    Dispense:  1 Inhaler    Refill:  0  . ipratropium (ATROVENT) 0.06 % nasal spray    Sig: Place 2 sprays into both nostrils 3 (three) times daily.    Dispense:  15 mL    Refill:  2    Jessica Hulen Skains, DO [facility] [phone]

## 2019-01-14 NOTE — Patient Instructions (Signed)
Because you are actively trying to become pregnant I have recommended that you continue over-the-counter homeopathic remedies for cough.  We discussed avoiding NSAIDs and only using Tylenol if needed for headaches.  I have prescribed you Augmentin to take twice daily, you can start your first dose this afternoon.  I have also prescribed you prednisone Dosepak, albuterol inhaler and renewed your nasal spray.  We discussed reasons for reevaluation and emergent evaluation.  Continue hydrating, using proper hygiene practices.  Acute Bronchitis, Adult Acute bronchitis is when air tubes (bronchi) in the lungs suddenly get swollen. The condition can make it hard to breathe. It can also cause these symptoms:  A cough.  Coughing up clear, yellow, or green mucus.  Wheezing.  Chest congestion.  Shortness of breath.  A fever.  Body aches.  Chills.  A sore throat. Follow these instructions at home:  Medicines  Take over-the-counter and prescription medicines only as told by your doctor.  If you were prescribed an antibiotic medicine, take it as told by your doctor. Do not stop taking the antibiotic even if you start to feel better. General instructions  Rest.  Drink enough fluids to keep your pee (urine) pale yellow.  Avoid smoking and secondhand smoke. If you smoke and you need help quitting, ask your doctor. Quitting will help your lungs heal faster.  Use an inhaler, cool mist vaporizer, or humidifier as told by your doctor.  Keep all follow-up visits as told by your doctor. This is important. How is this prevented? To lower your risk of getting this condition again:  Wash your hands often with soap and water. If you cannot use soap and water, use hand sanitizer.  Avoid contact with people who have cold symptoms.  Try not to touch your hands to your mouth, nose, or eyes.  Make sure to get the flu shot every year. Contact a doctor if:  Your symptoms do not get better in 2  weeks. Get help right away if:  You cough up blood.  You have chest pain.  You have very bad shortness of breath.  You become dehydrated.  You faint (pass out) or keep feeling like you are going to pass out.  You keep throwing up (vomiting).  You have a very bad headache.  Your fever or chills gets worse. This information is not intended to replace advice given to you by your health care provider. Make sure you discuss any questions you have with your health care provider. Document Released: 03/26/2008 Document Revised: 05/22/2017 Document Reviewed: 03/28/2016 Elsevier Interactive Patient Education  2019 [employer].

## 2019-01-23 ENCOUNTER — Telehealth: Payer: Self-pay | Admitting: Pediatrics

## 2019-01-23 NOTE — Telephone Encounter (Signed)
Pt says she should have started her period 1-2 days ago and started feeling nauseated last night and this morning so she wasn't sure if she might be pregnant. Advised pt to take an at home urine pregnancy test and she says she already has an OB. Advised pt if urine pregnancy comes back positive to call OB for further instructions. Pt voiced understanding.

## 2019-02-10 ENCOUNTER — Ambulatory Visit (INDEPENDENT_AMBULATORY_CARE_PROVIDER_SITE_OTHER): Payer: Medicaid Other | Admitting: Family Medicine

## 2019-02-10 ENCOUNTER — Encounter: Payer: Self-pay | Admitting: Family Medicine

## 2019-02-10 ENCOUNTER — Other Ambulatory Visit: Payer: Self-pay

## 2019-02-10 DIAGNOSIS — H66002 Acute suppurative otitis media without spontaneous rupture of ear drum, left ear: Secondary | ICD-10-CM

## 2019-02-10 MED ORDER — AMOXICILLIN 875 MG PO TABS: 875.0000 mg | ORAL_TABLET | Freq: Two times a day (BID) | ORAL | 0 refills | Status: DC

## 2019-02-10 NOTE — Progress Notes (Signed)
Virtual Visit via telephone Note Due to COVID-19, visit is conducted virtually and was requested by patient.  I connected with Jessica Ferguson on 02/10/19 at 1135 by telephone and verified that I am speaking with the correct person using two identifiers. Jessica Ferguson is currently located at home and family is currently with them during visit. The provider, Kari Baars, FNP is located in their office at time of visit.  I discussed the limitations, risks, security and privacy concerns of performing an evaluation and management service by telephone and the availability of in person appointments. I also discussed with the patient that there may be a patient responsible charge related to this service. The patient expressed understanding and agreed to proceed.  Subjective:  Patient ID: Jessica Ferguson, female    DOB: 24-Dec-1990, 28 y.o.   MRN: [id number]  Chief Complaint:  Ear Pain   HPI: [name] is a 28 y.o. female presenting on 02/10/2019 for Ear Pain   Pt reports left ear pain for 1.5 weeks. States the pain is throbbing and if sounds like she has fluid behind her ear drum. Pt states she has not measured her temperature but does have chills. She denies dizziness, cough, congestion, or shortness of breath. No recent travel or known sick exposures. She has not tried anything for the pain. States at worst, pain is 5/10.    Relevant past medical, surgical, family, and social history reviewed and updated as indicated.  Allergies and medications reviewed and updated.   Past Medical History:  Diagnosis Date  . Headache    migraines-"born with them"    History reviewed. No pertinent surgical history.  Social History   Socioeconomic History  . Marital status: Married    Spouse name: Jessica Ferguson  . Number of children: 0  . Years of education: 6  . Highest education level: Not on file  Occupational History    Comment: [employer]  Social Needs  . Financial resource strain: Not  on file  . Food insecurity:    Worry: Not on file    Inability: Not on file  . Transportation needs:    Medical: Not on file    Non-medical: Not on file  Tobacco Use  . Smoking status: Never Smoker  . Smokeless tobacco: Never Used  Substance and Sexual Activity  . Alcohol use: No    Comment: rare  . Drug use: No  . Sexual activity: Yes    Birth control/protection: None  Lifestyle  . Physical activity:    Days per week: Not on file    Minutes per session: Not on file  . Stress: Not on file  Relationships  . Social connections:    Talks on phone: Not on file    Gets together: Not on file    Attends religious service: Not on file    Active member of club or organization: Not on file    Attends meetings of clubs or organizations: Not on file    Relationship status: Not on file  . Intimate partner violence:    Fear of current or ex partner: Not on file    Emotionally abused: Not on file    Physically abused: Not on file    Forced sexual activity: Not on file  Other Topics Concern  . Not on file  Social History Narrative   Lives with husband   Caffeine- Mtn Dew 1 a day to prevent headache/migraine    Outpatient Encounter Medications as of  02/10/2019  Medication Sig  . albuterol (PROVENTIL HFA;VENTOLIN HFA) 108 (90 Base) MCG/ACT inhaler Inhale 2 puffs into the lungs every 6 (six) hours as needed for wheezing or shortness of breath.  Marland Kitchen. amoxicillin (AMOXIL) 875 MG tablet Take 1 tablet (875 mg total) by mouth 2 (two) times daily. 1 po BID  . amoxicillin-clavulanate (AUGMENTIN) 875-125 MG tablet Take 1 tablet by mouth 2 (two) times daily.  Marland Kitchen. ipratropium (ATROVENT) 0.06 % nasal spray Place 2 sprays into both nostrils 3 (three) times daily.  . predniSONE (STERAPRED UNI-PAK 21 TAB) 10 MG (21) TBPK tablet Use as directed   No facility-administered encounter medications on file as of 02/10/2019.     Allergies  Allergen Reactions  . Celery Oil     migraine  . Asa [Aspirin] Nausea  And Vomiting    migraine    Review of Systems  Constitutional: Positive for chills. Negative for fatigue and fever.  HENT: Positive for ear pain. Negative for congestion, ear discharge, postnasal drip, rhinorrhea, sinus pressure, sinus pain and sore throat.   Respiratory: Negative for cough and shortness of breath.   Cardiovascular: Negative for chest pain and palpitations.  Gastrointestinal: Negative for nausea.  Neurological: Negative for dizziness, syncope, weakness, light-headedness and headaches.  Psychiatric/Behavioral: Negative for confusion.  All other systems reviewed and are negative.        Observations/Objective: No vital signs or physical exam, this was a telephone or virtual health encounter.  Pt alert and oriented, answers all questions appropriately, and able to speak in full sentences.    Assessment and Plan: Jessica Ferguson was seen today for ear pain.  Diagnoses and all orders for this visit:  Non-recurrent acute suppurative otitis media of left ear without spontaneous rupture of tympanic membrane Due to ongoing symptoms will treat with below. Report any new or worsening symptoms. Tylenol or motrin as needed for fever and pain control. -     amoxicillin (AMOXIL) 875 MG tablet; Take 1 tablet (875 mg total) by mouth 2 (two) times daily. 1 po BID     Follow Up Instructions: Return if symptoms worsen or fail to improve.    I discussed the assessment and treatment plan with the patient. The patient was provided an opportunity to ask questions and all were answered. The patient agreed with the plan and demonstrated an understanding of the instructions.   The patient was advised to call back or seek an in-person evaluation if the symptoms worsen or if the condition fails to improve as anticipated.  The above assessment and management plan was discussed with the patient. The patient verbalized understanding of and has agreed to the management plan. Patient is aware to call  the clinic if symptoms persist or worsen. Patient is aware when to return to the clinic for a follow-up visit. Patient educated on when it is appropriate to go to the emergency department.    I provided 15 minutes of non-face-to-face time during this encounter. The call started at 1135. The call ended at 1150.   Kari BaarsMichelle Maysel Mccolm, FNP-C [facility] [address] [place], [place] [address] [phone]

## 2019-02-12 ENCOUNTER — Ambulatory Visit: Payer: Medicaid Other | Admitting: Family

## 2019-02-13 ENCOUNTER — Other Ambulatory Visit: Payer: Self-pay | Admitting: Family

## 2019-02-13 MED ORDER — CIPROFLOXACIN-DEXAMETHASONE 0.3-0.1 % OT SUSP: 4.0000 [drp] | Freq: Two times a day (BID) | OTIC | 0 refills | Status: DC

## 2019-02-13 NOTE — Progress Notes (Signed)
Left message for patient to call and confirm need to take medications.

## 2019-02-13 NOTE — Progress Notes (Signed)
Continue with Augmentin and ciprodex drops Prescription sent to pharmacy.

## 2019-02-25 ENCOUNTER — Other Ambulatory Visit: Payer: Self-pay

## 2019-02-25 ENCOUNTER — Ambulatory Visit (INDEPENDENT_AMBULATORY_CARE_PROVIDER_SITE_OTHER): Payer: Medicaid Other | Admitting: Family Medicine

## 2019-02-25 ENCOUNTER — Encounter: Payer: Self-pay | Admitting: Family Medicine

## 2019-02-25 DIAGNOSIS — H66006 Acute suppurative otitis media without spontaneous rupture of ear drum, recurrent, bilateral: Secondary | ICD-10-CM | POA: Diagnosis not present

## 2019-02-25 NOTE — Progress Notes (Signed)
Virtual Visit via telephone Note Due to COVID-19, visit is conducted virtually and was requested by patient. This visit type was conducted due to national recommendations for restrictions regarding the COVID-19 Pandemic (e.g. social distancing) in an effort to limit this patient's exposure and mitigate transmission in our community. All issues noted in this document were discussed and addressed.  A physical exam was not performed with this format.   I connected with Jessica Ferguson on 02/25/19 at 1305 by telephone and verified that I am speaking with the correct person using two identifiers. Jessica Ferguson is currently located at home and family is currently with them during visit. The provider, Kari Baars, FNP is located in their office at time of visit.  I discussed the limitations, risks, security and privacy concerns of performing an evaluation and management service by telephone and the availability of in person appointments. I also discussed with the patient that there may be a patient responsible charge related to this service. The patient expressed understanding and agreed to proceed.  Subjective:  Patient ID: Jessica Ferguson, female    DOB: February 17, 1991, 28 y.o.   MRN: [id number]  Chief Complaint:  Ear Pain   HPI: [name] is a 28 y.o. female presenting on 02/25/2019 for Ear Pain   Pt reports ongoing bilateral ear pain. States she has been treated for otitis externa and media. States she was seen at Urgent Care today and placed on Ciprodex ear drops and oral antibiotics due to recurrence of otitis externa and media. States they told her she needed to see her PCP for a referral to ENT. This is the pts third infection in the last 2 months. She states she is compliant with prescribed medications and does complete the courses of antibiotics. No other associated symptoms.     Relevant past medical, surgical, family, and social history reviewed and updated as indicated.  Allergies  and medications reviewed and updated.   Past Medical History:  Diagnosis Date  . Headache    migraines-"born with them"    History reviewed. No pertinent surgical history.  Social History   Socioeconomic History  . Marital status: Married    Spouse name: Reuel Boom  . Number of children: 0  . Years of education: 20  . Highest education level: Not on file  Occupational History    Comment: [employer]  Social Needs  . Financial resource strain: Not on file  . Food insecurity:    Worry: Not on file    Inability: Not on file  . Transportation needs:    Medical: Not on file    Non-medical: Not on file  Tobacco Use  . Smoking status: Never Smoker  . Smokeless tobacco: Never Used  Substance and Sexual Activity  . Alcohol use: No    Comment: rare  . Drug use: No  . Sexual activity: Yes    Birth control/protection: None  Lifestyle  . Physical activity:    Days per week: Not on file    Minutes per session: Not on file  . Stress: Not on file  Relationships  . Social connections:    Talks on phone: Not on file    Gets together: Not on file    Attends religious service: Not on file    Active member of club or organization: Not on file    Attends meetings of clubs or organizations: Not on file    Relationship status: Not on file  . Intimate partner violence:  Fear of current or ex partner: Not on file    Emotionally abused: Not on file    Physically abused: Not on file    Forced sexual activity: Not on file  Other Topics Concern  . Not on file  Social History Narrative   Lives with husband   Caffeine- Mtn Dew 1 a day to prevent headache/migraine    Outpatient Encounter Medications as of 02/25/2019  Medication Sig  . albuterol (PROVENTIL HFA;VENTOLIN HFA) 108 (90 Base) MCG/ACT inhaler Inhale 2 puffs into the lungs every 6 (six) hours as needed for wheezing or shortness of breath.  Marland Kitchen. amoxicillin (AMOXIL) 875 MG tablet Take 1 tablet (875 mg total) by mouth 2 (two) times  daily. 1 po BID  . amoxicillin-clavulanate (AUGMENTIN) 875-125 MG tablet Take 1 tablet by mouth 2 (two) times daily.  . ciprofloxacin-dexamethasone (CIPRODEX) OTIC suspension Place 4 drops into both ears 2 (two) times daily.  Marland Kitchen. ipratropium (ATROVENT) 0.06 % nasal spray Place 2 sprays into both nostrils 3 (three) times daily.  . predniSONE (STERAPRED UNI-PAK 21 TAB) 10 MG (21) TBPK tablet Use as directed   No facility-administered encounter medications on file as of 02/25/2019.     Allergies  Allergen Reactions  . Celery Oil     migraine  . Asa [Aspirin] Nausea And Vomiting    migraine    Review of Systems  Constitutional: Negative for chills, fatigue and fever.  HENT: Positive for ear discharge and ear pain.   Respiratory: Negative for cough and shortness of breath.   Cardiovascular: Negative for chest pain and palpitations.  Neurological: Negative for dizziness, weakness, light-headedness and headaches.  Psychiatric/Behavioral: Negative for confusion.  All other systems reviewed and are negative.        Observations/Objective: No vital signs or physical exam, this was a telephone or virtual health encounter.  Pt alert and oriented, answers all questions appropriately, and able to speak in full sentences.    Assessment and Plan: [name] was seen today for ear pain.  Diagnoses and all orders for this visit:  Recurrent acute suppurative otitis media without spontaneous rupture of tympanic membrane of both sides Seen by Urgent Care and treated for otitis externa and otitis media. Due to recurrence of both, will refer to ENT.  -     Ambulatory referral to ENT     Follow Up Instructions: Return if symptoms worsen or fail to improve.    I discussed the assessment and treatment plan with the patient. The patient was provided an opportunity to ask questions and all were answered. The patient agreed with the plan and demonstrated an understanding of the instructions.   The  patient was advised to call back or seek an in-person evaluation if the symptoms worsen or if the condition fails to improve as anticipated.  The above assessment and management plan was discussed with the patient. The patient verbalized understanding of and has agreed to the management plan. Patient is aware to call the clinic if symptoms persist or worsen. Patient is aware when to return to the clinic for a follow-up visit. Patient educated on when it is appropriate to go to the emergency department.    I provided 15 minutes of non-face-to-face time during this encounter. The call started at 1305. The call ended at 1320. The other time was used for coordination of care.    Kari BaarsMichelle Rakes, FNP-C [facility] [address] [place], [place] [address] [phone]) [phone]

## 2019-05-13 ENCOUNTER — Encounter: Payer: Self-pay | Admitting: Family Medicine

## 2019-09-07 ENCOUNTER — Encounter: Payer: Self-pay | Admitting: Family Medicine

## 2019-09-07 ENCOUNTER — Ambulatory Visit (INDEPENDENT_AMBULATORY_CARE_PROVIDER_SITE_OTHER): Payer: Medicaid Other | Admitting: Family Medicine

## 2019-09-07 DIAGNOSIS — R6882 Decreased libido: Secondary | ICD-10-CM | POA: Insufficient documentation

## 2019-09-07 NOTE — Progress Notes (Signed)
Virtual Visit via telephone Note Due to COVID-19 pandemic this visit was conducted virtually. This visit type was conducted due to national recommendations for restrictions regarding the COVID-19 Pandemic (e.g. social distancing, sheltering in place) in an effort to limit this patient's exposure and mitigate transmission in our community. All issues noted in this document were discussed and addressed.  A physical exam was not performed with this format.   I connected with Jessica Ferguson on 09/07/2019 at 1335 by telephone and verified that I am speaking with the correct person using two identifiers. Jessica Ferguson is currently located at home and no one is currently with them during visit. The provider, Monia Pouch, FNP is located in their office at time of visit.  I discussed the limitations, risks, security and privacy concerns of performing an evaluation and management service by telephone and the availability of in person appointments. I also discussed with the patient that there may be a patient responsible charge related to this service. The patient expressed understanding and agreed to proceed.  Subjective:  Patient ID: Jessica Ferguson, female    DOB: [birth year], 28 y.o.   MRN: [id number]  Chief Complaint:  Decreased Libido   HPI: Jessica Ferguson is a 28 y.o. female presenting on 09/07/2019 for Decreased Libido   Pt presents today with complaints of decreased libido. Pt states this has been ongoing since her last miscarriage. Pt states she is seeing her OB/GYN and has had some blood tests completed. Pt states she has not received those results yet. States the OB/GYN placed her on Wellbutrin but she is hesitant to start this due to not tolerating medications very well. Pt states feels like she needs something to help with her sex drive but is scared to try any medications. Pt is currently on her menstrual cycle. No abnormal bleeding.     Relevant past medical, surgical, family, and  social history reviewed and updated as indicated.  Allergies and medications reviewed and updated.   Past Medical History:  Diagnosis Date   Headache    migraines-"born with them"    History reviewed. No pertinent surgical history.  Social History   Socioeconomic History   Marital status: Married    Spouse name: Jessica Ferguson   Number of children: 0   Years of education: 12   Highest education level: Not on file  Occupational History    Comment: [employer]  Scientist, product/process development strain: Not on file   Food insecurity    Worry: Not on file    Inability: Not on file   Transportation needs    Medical: Not on file    Non-medical: Not on file  Tobacco Use   Smoking status: Never Smoker   Smokeless tobacco: Never Used  Substance and Sexual Activity   Alcohol use: No    Comment: rare   Drug use: No   Sexual activity: Yes    Birth control/protection: None  Lifestyle   Physical activity    Days per week: Not on file    Minutes per session: Not on file   Stress: Not on file  Relationships   Social connections    Talks on phone: Not on file    Gets together: Not on file    Attends religious service: Not on file    Active member of club or organization: Not on file    Attends meetings of clubs or organizations: Not on file    Relationship status: Not  on file   Intimate partner violence    Fear of current or ex partner: Not on file    Emotionally abused: Not on file    Physically abused: Not on file    Forced sexual activity: Not on file  Other Topics Concern   Not on file  Social History Narrative   Lives with husband   Caffeine- Mtn Dew 1 a day to prevent headache/migraine    Outpatient Encounter Medications as of 09/07/2019  Medication Sig   albuterol (PROVENTIL HFA;VENTOLIN HFA) 108 (90 Base) MCG/ACT inhaler Inhale 2 puffs into the lungs every 6 (six) hours as needed for wheezing or shortness of breath.   ipratropium (ATROVENT) 0.06 %  nasal spray Place 2 sprays into both nostrils 3 (three) times daily.   [DISCONTINUED] amoxicillin (AMOXIL) 875 MG tablet Take 1 tablet (875 mg total) by mouth 2 (two) times daily. 1 po BID   [DISCONTINUED] amoxicillin-clavulanate (AUGMENTIN) 875-125 MG tablet Take 1 tablet by mouth 2 (two) times daily.   [DISCONTINUED] ciprofloxacin-dexamethasone (CIPRODEX) OTIC suspension Place 4 drops into both ears 2 (two) times daily.   [DISCONTINUED] predniSONE (STERAPRED UNI-PAK 21 TAB) 10 MG (21) TBPK tablet Use as directed   No facility-administered encounter medications on file as of 09/07/2019.     Allergies  Allergen Reactions   Aspirin Nausea And Vomiting    migraine migraine   Celery Oil     migraine    Review of Systems  Constitutional: Negative for activity change, appetite change, diaphoresis, fatigue, fever and unexpected weight change.  HENT: Negative.   Eyes: Negative.   Respiratory: Negative for cough, chest tightness and shortness of breath.   Cardiovascular: Negative for chest pain, palpitations and leg swelling.  Gastrointestinal: Negative for blood in stool, constipation, diarrhea, nausea and vomiting.  Endocrine: Negative.   Genitourinary: Negative for decreased urine volume, difficulty urinating, dyspareunia, dysuria, flank pain, frequency, genital sores, hematuria, menstrual problem, pelvic pain, urgency, vaginal bleeding, vaginal discharge and vaginal pain.       Decreased libido  Musculoskeletal: Negative for arthralgias and myalgias.  Skin: Negative.   Allergic/Immunologic: Negative.   Neurological: Negative for dizziness and headaches.  Hematological: Negative.   Psychiatric/Behavioral: Negative for agitation, behavioral problems, confusion, decreased concentration, dysphoric mood, hallucinations, self-injury, sleep disturbance and suicidal ideas. The patient is not nervous/anxious and is not hyperactive.   All other systems reviewed and are negative.         Observations/Objective: No vital signs or physical exam, this was a telephone or virtual health encounter.  Pt alert and oriented, answers all questions appropriately, and able to speak in full sentences.    Assessment and Plan: Jessica Ferguson was seen today for decreased libido.  Diagnoses and all orders for this visit:  Decreased libido Awaiting lab results from OB/GYN office. Pt will call today to follow up on tests results. Pt encouraged to try the Wellbutrin prescribed by her OB/GYN as this is beneficial in increasing libido. Pt to follow up with OB/GYN. Declined referral to counselor to determine possible underlying causes of decreased libido. Will discuss medication with her husband and decide if she wants to start taking it.     Follow Up Instructions: Return if symptoms worsen or fail to improve.    I discussed the assessment and treatment plan with the patient. The patient was provided an opportunity to ask questions and all were answered. The patient agreed with the plan and demonstrated an understanding of the instructions.   The patient was  advised to call back or seek an in-person evaluation if the symptoms worsen or if the condition fails to improve as anticipated.  The above assessment and management plan was discussed with the patient. The patient verbalized understanding of and has agreed to the management plan. Patient is aware to call the clinic if they develop any new symptoms or if symptoms persist or worsen. Patient is aware when to return to the clinic for a follow-up visit. Patient educated on when it is appropriate to go to the emergency department.    I provided 15 minutes of non-face-to-face time during this encounter. The call started at 1335. The call ended at 1350. The other time was used for coordination of care.    Kari Baars, FNP-C [facility] [address] [place], [place] [address] [phone] 09/07/2019

## 2019-10-13 ENCOUNTER — Encounter: Payer: Self-pay | Admitting: Family Medicine

## 2019-10-13 ENCOUNTER — Ambulatory Visit (INDEPENDENT_AMBULATORY_CARE_PROVIDER_SITE_OTHER): Payer: Medicaid Other | Admitting: Family Medicine

## 2019-10-13 DIAGNOSIS — J019 Acute sinusitis, unspecified: Secondary | ICD-10-CM

## 2019-10-13 MED ORDER — AMOXICILLIN-POT CLAVULANATE 875-125 MG PO TABS: 1.0000 | ORAL_TABLET | Freq: Two times a day (BID) | ORAL | 0 refills | Status: AC

## 2019-10-13 NOTE — Progress Notes (Signed)
   Virtual Visit via Telephone Note  I connected with Jessica Ferguson on 10/13/19 at 4:01 PM by telephone and verified that I am speaking with the correct person using two identifiers. Jessica Ferguson is currently located at home and nobody is currently with her during this visit. The provider, Loman Brooklyn, FNP is located in their office at time of visit.  I discussed the limitations, risks, security and privacy concerns of performing an evaluation and management service by telephone and the availability of in person appointments. I also discussed with the patient that there may be a patient responsible charge related to this service. The patient expressed understanding and agreed to proceed.  Subjective: PCP: Baruch Gouty, FNP  Chief Complaint  Patient presents with  . Cough   Patient complains of wet cough and nausea. Additional symptoms include headache, ear pain/pressure, facial pain/pressure and shortness of breath. Onset of symptoms was a few days ago, gradually worsening since that time. She is drinking plenty of fluids. Evaluation to date: none. Treatment to date: nasal steroids and Albuterol inhaler. She has a history of seasonal allergies. She does not smoke.    ROS: Per HPI  Current Outpatient Medications:  .  albuterol (PROVENTIL HFA;VENTOLIN HFA) 108 (90 Base) MCG/ACT inhaler, Inhale 2 puffs into the lungs every 6 (six) hours as needed for wheezing or shortness of breath., Disp: 1 Inhaler, Rfl: 0 .  amoxicillin-clavulanate (AUGMENTIN) 875-125 MG tablet, Take 1 tablet by mouth 2 (two) times daily for 7 days., Disp: 14 tablet, Rfl: 0 .  ipratropium (ATROVENT) 0.06 % nasal spray, Place 2 sprays into both nostrils 3 (three) times daily., Disp: 15 mL, Rfl: 2  Allergies  Allergen Reactions  . Aspirin Nausea And Vomiting    migraine migraine  . Celery Oil     migraine   Past Medical History:  Diagnosis Date  . Headache    migraines-"born with them"     Observations/Objective: A&O  No respiratory distress or wheezing audible over the phone Mood, judgement, and thought processes all WNL  Assessment and Plan: 1. Acute non-recurrent sinusitis, unspecified location - Education provided on COVID-19 and sinusitis.  Discussed symptom management.  Encourage patient to go get tested for COVID-19.  Advised to quarantine until she gets her results back. - amoxicillin-clavulanate (AUGMENTIN) 875-125 MG tablet; Take 1 tablet by mouth 2 (two) times daily for 7 days.  Dispense: 14 tablet; Refill: 0   Follow Up Instructions:  I discussed the assessment and treatment plan with the patient. The patient was provided an opportunity to ask questions and all were answered. The patient agreed with the plan and demonstrated an understanding of the instructions.   The patient was advised to call back or seek an in-person evaluation if the symptoms worsen or if the condition fails to improve as anticipated.  The above assessment and management plan was discussed with the patient. The patient verbalized understanding of and has agreed to the management plan. Patient is aware to call the clinic if symptoms persist or worsen. Patient is aware when to return to the clinic for a follow-up visit. Patient educated on when it is appropriate to go to the emergency department.   Time call ended: 4:11 PM  I provided 14 minutes of non-face-to-face time during this encounter.  Hendricks Limes, MSN, APRN, FNP-C [facility] 10/13/19

## 2019-10-13 NOTE — Patient Instructions (Signed)
This information is directly available on the CDC website: RunningShows.co.za.html    Source:CDC Reference to specific commercial products, manufacturers, companies, or trademarks does not constitute its endorsement or recommendation by the [place], [place], or Centers for [employer] and Prevention.   Sinusitis, Adult Sinusitis is soreness and swelling (inflammation) of your sinuses. Sinuses are hollow spaces in the bones around your face. They are located:  Around your eyes.  In the middle of your forehead.  Behind your nose.  In your cheekbones. Your sinuses and nasal passages are lined with a fluid called mucus. Mucus drains out of your sinuses. Swelling can trap mucus in your sinuses. This lets germs (bacteria, virus, or fungus) grow, which leads to infection. Most of the time, this condition is caused by a virus. What are the causes? This condition is caused by:  Allergies.  Asthma.  Germs.  Things that block your nose or sinuses.  Growths in the nose (nasal polyps).  Chemicals or irritants in the air.  Fungus (rare). What increases the risk? You are more likely to develop this condition if:  You have a weak body defense system (immune system).  You do a lot of swimming or diving.  You use nasal sprays too much.  You smoke. What are the signs or symptoms? The main symptoms of this condition are pain and a feeling of pressure around the sinuses. Other symptoms include:  Stuffy nose (congestion).  Runny nose (drainage).  Swelling and warmth in the sinuses.  Headache.  Toothache.  A cough that may get worse at night.  Mucus that collects in the throat or the back of the nose (postnasal drip).  Being unable to smell and taste.  Being very tired (fatigue).  A fever.  Sore throat.  Bad breath. How is this diagnosed? This condition is diagnosed  based on:  Your symptoms.  Your medical history.  A physical exam.  Tests to find out if your condition is short-term (acute) or long-term (chronic). Your doctor may: ? Check your nose for growths (polyps). ? Check your sinuses using a tool that has a light (endoscope). ? Check for allergies or germs. ? Do imaging tests, such as an MRI or CT scan. How is this treated? Treatment for this condition depends on the cause and whether it is short-term or long-term.  If caused by a virus, your symptoms should go away on their own within 10 days. You may be given medicines to relieve symptoms. They include: ? Medicines that shrink swollen tissue in the nose. ? Medicines that treat allergies (antihistamines). ? A spray that treats swelling of the nostrils. ? Rinses that help get rid of thick mucus in your nose (nasal saline washes).  If caused by bacteria, your doctor may wait to see if you will get better without treatment. You may be given antibiotic medicine if you have: ? A very bad infection. ? A weak body defense system.  If caused by growths in the nose, you may need to have surgery. Follow these instructions at home: Medicines  Take, use, or apply over-the-counter and prescription medicines only as told by your doctor. These may include nasal sprays.  If you were prescribed an antibiotic medicine, take it as told by your doctor. Do not stop taking the antibiotic even if you start to feel better. Hydrate and humidify   Drink enough water to keep your pee (urine) pale yellow.  Use a cool mist humidifier to keep  the humidity level in your home above 50%.  Breathe in steam for 10-15 minutes, 3-4 times a day, or as told by your doctor. You can do this in the bathroom while a hot shower is running.  Try not to spend time in cool or dry air. Rest  Rest as much as you can.  Sleep with your head raised (elevated).  Make sure you get enough sleep each night. General  instructions   Put a warm, moist washcloth on your face 3-4 times a day, or as often as told by your doctor. This will help with discomfort.  Wash your hands often with soap and water. If there is no soap and water, use hand sanitizer.  Do not smoke. Avoid being around people who are smoking (secondhand smoke).  Keep all follow-up visits as told by your doctor. This is important. Contact a doctor if:  You have a fever.  Your symptoms get worse.  Your symptoms do not get better within 10 days. Get help right away if:  You have a very bad headache.  You cannot stop throwing up (vomiting).  You have very bad pain or swelling around your face or eyes.  You have trouble seeing.  You feel confused.  Your neck is stiff.  You have trouble breathing. Summary  Sinusitis is swelling of your sinuses. Sinuses are hollow spaces in the bones around your face.  This condition is caused by tissues in your nose that become inflamed or swollen. This traps germs. These can lead to infection.  If you were prescribed an antibiotic medicine, take it as told by your doctor. Do not stop taking it even if you start to feel better.  Keep all follow-up visits as told by your doctor. This is important. This information is not intended to replace advice given to you by your health care provider. Make sure you discuss any questions you have with your health care provider. Document Released: 03/26/2008 Document Revised: 03/10/2018 Document Reviewed: 03/10/2018 Elsevier Patient Education  2020 [employer].

## 2020-01-13 ENCOUNTER — Encounter: Payer: Self-pay | Admitting: Family Medicine

## 2020-01-13 ENCOUNTER — Other Ambulatory Visit: Payer: Self-pay

## 2020-01-13 ENCOUNTER — Ambulatory Visit (INDEPENDENT_AMBULATORY_CARE_PROVIDER_SITE_OTHER): Payer: Medicaid Other | Admitting: Family Medicine

## 2020-01-13 VITALS — BP 104/64 | HR 86 | Temp 99.0°F | Resp 20 | Ht 61.5 in | Wt 106.0 lb

## 2020-01-13 DIAGNOSIS — N393 Stress incontinence (female) (male): Secondary | ICD-10-CM | POA: Diagnosis not present

## 2020-01-13 NOTE — Progress Notes (Signed)
Subjective:  Patient ID: Jessica Ferguson, female    DOB: 07-10-1991, 29 y.o.   MRN: [id number]  Patient Care Team: Sonny Masters, FNP as PCP - General (Family Medicine)   Chief Complaint:  bladder control issues   HPI: Jessica Ferguson is a 29 y.o. female presenting on 01/13/2020 for bladder control issues  Pt reports bladder leaking when jumping rope. Denies symptoms at any other time. No urinary symptoms.    Relevant past medical, surgical, family, and social history reviewed and updated as indicated.  Allergies and medications reviewed and updated. Date reviewed: Chart in Epic.   Past Medical History:  Diagnosis Date  . Headache    migraines-"born with them"    History reviewed. No pertinent surgical history.  Social History   Socioeconomic History  . Marital status: Married    Spouse name: Reuel Boom  . Number of children: 0  . Years of education: 30  . Highest education level: Not on file  Occupational History    Comment: [employer]  Tobacco Use  . Smoking status: Never Smoker  . Smokeless tobacco: Never Used  Substance and Sexual Activity  . Alcohol use: No    Comment: rare  . Drug use: No  . Sexual activity: Yes    Birth control/protection: None  Other Topics Concern  . Not on file  Social History Narrative   Lives with husband   Caffeine- Mtn Dew 1 a day to prevent headache/migraine   Social Determinants of Health   Financial Resource Strain:   . Difficulty of Paying Living Expenses:   Food Insecurity:   . Worried About Programme researcher, broadcasting/film/video in the Last Year:   . Barista in the Last Year:   Transportation Needs:   . Freight forwarder (Medical):   Marland Kitchen Lack of Transportation (Non-Medical):   Physical Activity:   . Days of Exercise per Week:   . Minutes of Exercise per Session:   Stress:   . Feeling of Stress :   Social Connections:   . Frequency of Communication with Friends and Family:   . Frequency of Social Gatherings with  Friends and Family:   . Attends Religious Services:   . Active Member of Clubs or Organizations:   . Attends Banker Meetings:   Marland Kitchen Marital Status:   Intimate Partner Violence:   . Fear of Current or Ex-Partner:   . Emotionally Abused:   Marland Kitchen Physically Abused:   . Sexually Abused:     Outpatient Encounter Medications as of 01/13/2020  Medication Sig  . albuterol (PROVENTIL HFA;VENTOLIN HFA) 108 (90 Base) MCG/ACT inhaler Inhale 2 puffs into the lungs every 6 (six) hours as needed for wheezing or shortness of breath.  Marland Kitchen ipratropium (ATROVENT) 0.06 % nasal spray Place 2 sprays into both nostrils 3 (three) times daily.   No facility-administered encounter medications on file as of 01/13/2020.    Allergies  Allergen Reactions  . Aspirin Nausea And Vomiting    migraine migraine  . Celery Oil     migraine    Review of Systems  Constitutional: Negative for activity change, appetite change, chills, fatigue and fever.  HENT: Negative.   Eyes: Negative.   Respiratory: Negative for cough, chest tightness and shortness of breath.   Cardiovascular: Negative for chest pain, palpitations and leg swelling.  Gastrointestinal: Negative for blood in stool, constipation, diarrhea, nausea and vomiting.  Endocrine: Negative.   Genitourinary: Negative for decreased urine  volume, difficulty urinating, dyspareunia, dysuria, flank pain, frequency, genital sores, hematuria, menstrual problem, pelvic pain, urgency, vaginal bleeding, vaginal discharge and vaginal pain.       Stress incontinence  Musculoskeletal: Negative for arthralgias and myalgias.  Skin: Negative.   Allergic/Immunologic: Negative.   Neurological: Negative for dizziness and headaches.  Hematological: Negative.   Psychiatric/Behavioral: Negative for confusion, hallucinations, sleep disturbance and suicidal ideas.  All other systems reviewed and are negative.       Objective:  BP 104/64   Pulse 86   Temp 99 F (37.2  C)   Resp 20   Ht 5' 1.5" (1.562 m)   Wt 106 lb (48.1 kg)   LMP 12/25/2019   SpO2 99%   BMI 19.70 kg/m    Wt Readings from Last 3 Encounters:  01/13/20 106 lb (48.1 kg)  07/28/18 98 lb 9.6 oz (44.7 kg)  11/28/17 94 lb 3.2 oz (42.7 kg)    Physical Exam Vitals and nursing note reviewed.  Constitutional:      General: She is not in acute distress.    Appearance: Normal appearance. She is well-developed and well-groomed. She is not ill-appearing, toxic-appearing or diaphoretic.  HENT:     Head: Normocephalic and atraumatic.     Jaw: There is normal jaw occlusion.     Right Ear: Hearing normal.     Left Ear: Hearing normal.     Nose: Nose normal.     Mouth/Throat:     Lips: Pink.     Mouth: Mucous membranes are moist.     Pharynx: Oropharynx is clear. Uvula midline.  Eyes:     General: Lids are normal.     Extraocular Movements: Extraocular movements intact.     Conjunctiva/sclera: Conjunctivae normal.     Pupils: Pupils are equal, round, and reactive to light.  Neck:     Thyroid: No thyroid mass, thyromegaly or thyroid tenderness.     Vascular: No carotid bruit or JVD.     Trachea: Trachea and phonation normal.  Cardiovascular:     Rate and Rhythm: Normal rate and regular rhythm.     Chest Wall: PMI is not displaced.     Pulses: Normal pulses.     Heart sounds: Normal heart sounds. No murmur. No friction rub. No gallop.   Pulmonary:     Effort: Pulmonary effort is normal. No respiratory distress.     Breath sounds: Normal breath sounds. No wheezing.  Abdominal:     General: Bowel sounds are normal. There is no distension or abdominal bruit.     Palpations: Abdomen is soft. There is no hepatomegaly or splenomegaly.     Tenderness: There is no abdominal tenderness. There is no right CVA tenderness or left CVA tenderness.     Hernia: No hernia is present.  Musculoskeletal:        General: Normal range of motion.     Cervical back: Normal range of motion and neck  supple.     Right lower leg: No edema.     Left lower leg: No edema.  Lymphadenopathy:     Cervical: No cervical adenopathy.  Skin:    General: Skin is warm and dry.     Capillary Refill: Capillary refill takes less than 2 seconds.     Coloration: Skin is not cyanotic, jaundiced or pale.     Findings: No rash.  Neurological:     General: No focal deficit present.     Mental Status: She is alert  and oriented to person, place, and time.     Cranial Nerves: Cranial nerves are intact. No cranial nerve deficit.     Sensory: Sensation is intact. No sensory deficit.     Motor: Motor function is intact. No weakness.     Coordination: Coordination is intact. Coordination normal.     Gait: Gait is intact. Gait normal.     Deep Tendon Reflexes: Reflexes are normal and symmetric. Reflexes normal.  Psychiatric:        Attention and Perception: Attention and perception normal.        Mood and Affect: Mood and affect normal.        Speech: Speech normal.        Behavior: Behavior normal. Behavior is cooperative.        Thought Content: Thought content normal.        Cognition and Memory: Cognition and memory normal.        Judgment: Judgment normal.     Results for orders placed or performed in visit on 10/09/16  Pregnancy, urine  Result Value Ref Range   Preg Test, Ur Positive (A) Negative  Beta HCG, Quant  Result Value Ref Range   hCG Quant 37,074 mIU/mL       Pertinent labs & imaging results that were available during my care of the patient were reviewed by me and considered in my medical decision making.  Assessment & Plan:  Chayla was seen today for bladder control issues.  Diagnoses and all orders for this visit:  Stress incontinence in female Kegel exercises discussed in detail. Pt aware to do this several times per day for at least 4-6 weeks. If symptoms persist, pt to follow up. Will check urine to rule out UTI.  -     Urinalysis, Complete     Continue all other  maintenance medications.  Follow up plan: Return if symptoms worsen or fail to improve.   Continue healthy lifestyle choices, including diet (rich in fruits, vegetables, and lean proteins, and low in salt and simple carbohydrates) and exercise (at least 30 minutes of moderate physical activity daily).  Educational handout given for Kegel Exercises   The above assessment and management plan was discussed with the patient. The patient verbalized understanding of and has agreed to the management plan. Patient is aware to call the clinic if they develop any new symptoms or if symptoms persist or worsen. Patient is aware when to return to the clinic for a follow-up visit. Patient educated on when it is appropriate to go to the emergency department.   Kari Baars, FNP-C [facility] [phone]

## 2020-01-13 NOTE — Patient Instructions (Signed)

## 2020-01-14 LAB — URINALYSIS, COMPLETE
Bilirubin, UA: NEGATIVE
Glucose, UA: NEGATIVE
Ketones, UA: NEGATIVE
Leukocytes,UA: NEGATIVE
Nitrite, UA: NEGATIVE
Protein,UA: NEGATIVE
Specific Gravity, UA: 1.025 (ref 1.005–1.030)
Urobilinogen, Ur: 0.2 mg/dL (ref 0.2–1.0)
pH, UA: 6 (ref 5.0–7.5)

## 2020-01-14 LAB — MICROSCOPIC EXAMINATION
Epithelial Cells (non renal): >10 /hpf — AB (ref 0–10)
Renal Epithel, UA: NONE SEEN /hpf

## 2020-02-04 ENCOUNTER — Encounter: Payer: Medicaid Other | Admitting: Family Medicine

## 2020-02-04 NOTE — Progress Notes (Signed)
Called patient multiple times. Unable to reach. Voicemail left. No return call.

## 2021-03-22 DIAGNOSIS — O4700 False labor before 37 completed weeks of gestation, unspecified trimester: Secondary | ICD-10-CM | POA: Insufficient documentation

## 2021-05-12 ENCOUNTER — Ambulatory Visit (INDEPENDENT_AMBULATORY_CARE_PROVIDER_SITE_OTHER): Payer: Medicaid Other | Admitting: Family Medicine

## 2021-05-12 ENCOUNTER — Other Ambulatory Visit: Payer: Self-pay

## 2021-05-12 ENCOUNTER — Encounter: Payer: Self-pay | Admitting: Family Medicine

## 2021-05-12 VITALS — BP 105/69 | HR 83 | Temp 97.2°F | Ht 61.5 in | Wt 102.2 lb

## 2021-05-12 DIAGNOSIS — R627 Adult failure to thrive: Secondary | ICD-10-CM | POA: Diagnosis not present

## 2021-05-12 DIAGNOSIS — Z23 Encounter for immunization: Secondary | ICD-10-CM | POA: Diagnosis not present

## 2021-05-12 DIAGNOSIS — Z8639 Personal history of other endocrine, nutritional and metabolic disease: Secondary | ICD-10-CM

## 2021-05-12 NOTE — Patient Instructions (Signed)
Hypoglycemia Hypoglycemia occurs when the level of sugar (glucose) in the blood is too low. Hypoglycemia can happen in people who have or do not have diabetes. It can develop quickly, and it can be a medical emergency. For most people, a blood glucose level below 70 mg/dL (3.9 mmol/L) is considered hypoglycemia. Glucose is a type of sugar that provides the body's main source of energy. Certain hormones (insulin and glucagon) control the level of glucose in the blood. Insulin lowers blood glucose, and glucagon raises blood glucose. Hypoglycemia can result from having too much insulin in the bloodstream, or from not eating enough food that contains glucose. You may also have reactive hypoglycemia, which happens within 4 hours after eating a meal. What are the causes? Hypoglycemia occurs most often in people who have diabetes and may be caused by: Diabetes medicine. Not eating enough, or not eating often enough. Increased physical activity. Drinking alcohol on an empty stomach. If you do not have diabetes, hypoglycemia may be caused by: A tumor in the pancreas. Not eating enough, or not eating for long periods at a time (fasting). A severe infection or illness. Problems after having bariatric surgery. Organ failure, such as kidney or liver failure. Certain medicines. What increases the risk? Hypoglycemia is more likely to develop in people who: Have diabetes and take medicines to lower blood glucose. Abuse alcohol. Have a severe illness. What are the signs or symptoms? Symptoms vary depending on whether the condition is mild, moderate, or severe. Mild hypoglycemia Hunger. Sweating and feeling clammy. Dizziness or feeling light-headed. Sleepiness or restless sleep. Nausea. Increased heart rate. Headache. Blurry vision. Mood changes, such as irritability or anxiety. Tingling or numbness around the mouth, lips, or tongue. Moderate hypoglycemia Confusion and poor judgment. Behavior  changes. Weakness. Irregular heartbeat. A change in coordination. Severe hypoglycemia Severe hypoglycemia is a medical emergency. It can cause: Fainting. Seizures. Loss of consciousness (coma). Death. How is this diagnosed? Hypoglycemia is diagnosed with a blood test to measure your blood glucose level. This blood test is done while you are having symptoms. Your health care provider may also do a physical exam and review your medical history. How is this treated? This condition can be treated by immediately eating or drinking something that contains sugar with 15 grams of fast-acting carbohydrate, such as: 4 oz (120 mL) of fruit juice. 4 oz (120 mL) of regular soda (not diet soda). Several pieces of hard candy. Check food labels to find out how many pieces to eat for 15 grams. 1 Tbsp (15 mL) of sugar or honey. 4 glucose tablets. 1 tube of glucose gel. Treating hypoglycemia if you have diabetes If you are alert and able to swallow safely, follow the 15:15 rule: Take 15 grams of a fast-acting carbohydrate. Talk with your health care provider about how much you should take. Options for getting 15 grams of fast-acting carbohydrate include: Glucose tablets (take 4 tablets). Several pieces of hard candy. Check food labels to find out how many pieces to eat for 15 grams. 4 oz (120 mL) of fruit juice. 4 oz (120 mL) of regular soda (not diet soda). 1 Tbsp (15 mL) of sugar or honey. 1 tube of glucose gel. Check your blood glucose 15 minutes after you take the carbohydrate. If the repeat blood glucose level is still at or below 70 mg/dL (3.9 mmol/L), take 15 grams of a carbohydrate again. If your blood glucose level does not increase above 70 mg/dL (3.9 mmol/L) after 3 tries, seek emergency   medical care. After your blood glucose level returns to normal, eat a meal or a snack within 1 hour.  Treating severe hypoglycemia Severe hypoglycemia is when your blood glucose level is below 54 mg/dL (3  mmol/L). Severe hypoglycemia is a medical emergency. Get medical help right away. If you have severe hypoglycemia and you cannot eat or drink, you will need to be given glucagon. A family member or close friend should learn how to check your blood glucose and how to give you glucagon. Ask your health care provider if you need to have an emergency glucagon kit available. Severe hypoglycemia may need to be treated in a hospital. The treatment may include getting glucose through an IV. You may also need treatment for the cause of your hypoglycemia. Follow these instructions at home: General instructions Take over-the-counter and prescription medicines only as told by your health care provider. Monitor your blood glucose as told by your health care provider. If you drink alcohol: Limit how much you have to: 0-1 drink a day for women who are not pregnant. 0-2 drinks a day for men. Know how much alcohol is in your drink. In the [place] one drink equals one 12 oz bottle of beer (355 mL), one 5 oz glass of wine (148 mL), or one 1 oz glass of hard liquor (44 mL). Be sure to eat food along with drinking alcohol. Be aware that alcohol is absorbed quickly and may have lingering effects that may result in hypoglycemia later. Be sure to do ongoing glucose monitoring. Keep all follow-up visits. This is important. If you have diabetes: Always have a fast-acting carbohydrate (15 grams) option with you to treat low blood glucose. Follow your diabetes management plan as directed by your health care provider. Make sure you: Know the symptoms of hypoglycemia. It is important to treat it right away to prevent it from becoming severe. Check your blood glucose as often as told. Always check before and after exercise. Always check your blood glucose before you drive a motorized vehicle. Take your medicines as told. Follow your meal plan. Eat on time, and do not skip meals. Share your diabetes management plan with  people in your workplace, school, and household. Carry a medical alert card or wear medical alert jewelry. Where to find more information American Diabetes Association: www.diabetes.org Contact a health care provider if: You have problems keeping your blood glucose in your target range. You have frequent episodes of hypoglycemia. Get help right away if: You continue to have hypoglycemia symptoms after eating or drinking something that contains 15 grams of fast-acting carbohydrate, and you cannot get your blood glucose above 70 mg/dL (3.9 mmol/L) while following the 15:15 rule. Your blood glucose is below 54 mg/dL (3 mmol/L). You have a seizure. You faint. These symptoms may represent a serious problem that is an emergency. Do not wait to see if the symptoms will go away. Get medical help right away. Call your local emergency services (911 in the [place] Do not drive yourself to the hospital. Summary Hypoglycemia occurs when the level of sugar (glucose) in the blood is too low. Hypoglycemia can happen in people who have or do not have diabetes. It can develop quickly, and it can be a medical emergency. Make sure you know the symptoms of hypoglycemia and how to treat it. Always have a fast-acting carbohydrate option with you to treat low blood sugar. This information is not intended to replace advice given to you by your health care provider. Make   sure you discuss any questions you have with your health care provider. Document Revised: 09/08/2020 Document Reviewed: 09/08/2020 Elsevier Patient Education  2022 Elsevier Inc.  

## 2021-05-12 NOTE — Progress Notes (Signed)
Acute Office Visit  Subjective:    Patient ID: Jessica Ferguson, female    DOB: 1991-02-07, 30 y.o.   MRN: [id number]  Chief Complaint  Patient presents with   Immunizations    HPI Patient is in today for immunizations. She had the first of the MMR and Varicella immunizations after delivering her child about 6 weeks ago. She needs to have her 2nd doses completed today.   She is concerned about her inability to gain weight and recurrent low blood sugars. She reports that while in the hospital while pregnant they had to keep her on IV fluids to maintain her blood sugar. She reports that her blood sugar would drop even with eating large meals. She also reports that she has difficulty gaining weight. She eats 3000-4000 calories a day with around 80g of protein but has been unable to gain weight. She drinks 2 gallons of water a day. She is currently breastfeeding. She reports weighting 110 pounds pre-pregnancy and 112 at delivery. She is now 102 pounds.   She ate about 1 hour prior to labs. She reports that she is unable to fast for labs as her blood sugar drops and she is breastfeeding.   Past Medical History:  Diagnosis Date   Headache    migraines-"born with them"    Past Surgical History:  Procedure Laterality Date   CESAREAN SECTION     TUBAL LIGATION      History reviewed. No pertinent family history.  Social History   Socioeconomic History   Marital status: Married    Spouse name: Quillian Quince   Number of children: 0   Years of education: 12   Highest education level: Not on file  Occupational History    Comment: [employer]  Tobacco Use   Smoking status: Never   Smokeless tobacco: Never  Substance and Sexual Activity   Alcohol use: No    Comment: rare   Drug use: No   Sexual activity: Yes    Birth control/protection: None  Other Topics Concern   Not on file  Social History Narrative   Lives with husband   Caffeine- Mtn Dew 1 a day to prevent headache/migraine    Social Determinants of Health   Financial Resource Strain: Not on file  Food Insecurity: Not on file  Transportation Needs: Not on file  Physical Activity: Not on file  Stress: Not on file  Social Connections: Not on file  Intimate Partner Violence: Not on file    Outpatient Medications Prior to Visit  Medication Sig Dispense Refill   albuterol (PROVENTIL HFA;VENTOLIN HFA) 108 (90 Base) MCG/ACT inhaler Inhale 2 puffs into the lungs every 6 (six) hours as needed for wheezing or shortness of breath. 1 Inhaler 0   ipratropium (ATROVENT) 0.06 % nasal spray Place 2 sprays into both nostrils 3 (three) times daily. 15 mL 2   No facility-administered medications prior to visit.    Allergies  Allergen Reactions   Aspirin Nausea And Vomiting    migraine migraine   Celery Oil     migraine    Review of Systems Return to office for new or worsening symptoms, or if symptoms persist.     Objective:    Physical Exam Vitals and nursing note reviewed.  Constitutional:      General: She is not in acute distress.    Appearance: She is not ill-appearing, toxic-appearing or diaphoretic.  Pulmonary:     Effort: Pulmonary effort is normal. No respiratory distress.  Musculoskeletal:     Right lower leg: No edema.     Left lower leg: No edema.  Skin:    General: Skin is warm and dry.  Neurological:     General: No focal deficit present.     Mental Status: She is alert and oriented to person, place, and time.  Psychiatric:        Mood and Affect: Mood normal.        Behavior: Behavior normal.    BP 105/69   Pulse 83   Temp (!) 97.2 F (36.2 C)   Ht 5' 1.5" (1.562 m)   Wt 102 lb 3.2 oz (46.4 kg)   SpO2 96%   BMI 19.00 kg/m  Wt Readings from Last 3 Encounters:  05/12/21 102 lb 3.2 oz (46.4 kg)  01/13/20 106 lb (48.1 kg)  07/28/18 98 lb 9.6 oz (44.7 kg)    Health Maintenance Due  Topic Date Due   HIV Screening  Never done   Hepatitis C Screening  Never done    PAP-Cervical Cytology Screening  05/18/2019   PAP SMEAR-Modifier  05/18/2019    There are no preventive care reminders to display for this patient.   No results found for: TSH Lab Results  Component Value Date   WBC 8.4 11/01/2014   HGB 13.6 11/01/2014   HCT 42.7 11/01/2014   MCV 85.7 11/01/2014   No results found for: NA, K, CHLORIDE, CO2, GLUCOSE, BUN, CREATININE, BILITOT, ALKPHOS, AST, ALT, PROT, ALBUMIN, CALCIUM, ANIONGAP, EGFR, GFR No results found for: CHOL No results found for: HDL No results found for: LDLCALC No results found for: TRIG No results found for: CHOLHDL No results found for: HGBA1C     Assessment & Plan:   Nattalie was seen today for immunizations.  Diagnoses and all orders for this visit:  Need for vaccination Vaccines today in office.  -     MMR vaccine subcutaneous -     Varicella vaccine subcutaneous  History of hypoglycemia Labs pending. Non-fasting.  -     CBC with Differential/Platelet -     CMP14+EGFR -     Thyroid Panel With TSH  Poor weight gain in adult Labs pending. Reports eating 3000-4000 calories a day with 80g  of protein.  -     CBC with Differential/Platelet -     CMP14+EGFR -     Thyroid Panel With TSH  Return to office for new or worsening symptoms, or if symptoms persist.   The patient indicates understanding of these issues and agrees with the plan.  Gwenlyn Perking, FNP

## 2021-05-13 LAB — CMP14+EGFR
ALT: 14 IU/L (ref 0–32)
AST: 19 IU/L (ref 0–40)
Albumin/Globulin Ratio: 1.9 (ref 1.2–2.2)
Albumin: 4.3 g/dL (ref 3.9–5.0)
Alkaline Phosphatase: 94 IU/L (ref 44–121)
BUN/Creatinine Ratio: 12 (ref 9–23)
BUN: 9 mg/dL (ref 6–20)
Bilirubin Total: 0.3 mg/dL (ref 0.0–1.2)
CO2: 24 mmol/L (ref 20–29)
Calcium: 9.6 mg/dL (ref 8.7–10.2)
Chloride: 105 mmol/L (ref 96–106)
Creatinine, Ser: 0.74 mg/dL (ref 0.57–1.00)
Globulin, Total: 2.3 g/dL (ref 1.5–4.5)
Glucose: 84 mg/dL (ref 65–99)
Potassium: 5 mmol/L (ref 3.5–5.2)
Sodium: 141 mmol/L (ref 134–144)
Total Protein: 6.6 g/dL (ref 6.0–8.5)
eGFR: 112 mL/min/{1.73_m2} (ref >59)

## 2021-05-13 LAB — CBC WITH DIFFERENTIAL/PLATELET
Basophils Absolute: 0 10*3/uL (ref 0.0–0.2)
Basos: 1 %
EOS (ABSOLUTE): 0.2 10*3/uL (ref 0.0–0.4)
Eos: 3 %
Hematocrit: 39.2 % (ref 34.0–46.6)
Hemoglobin: 12.1 g/dL (ref 11.1–15.9)
Immature Grans (Abs): 0 10*3/uL (ref 0.0–0.1)
Immature Granulocytes: 0 %
Lymphocytes Absolute: 1.5 10*3/uL (ref 0.7–3.1)
Lymphs: 26 %
MCH: 24 pg — ABNORMAL LOW (ref 26.6–33.0)
MCHC: 30.9 g/dL — ABNORMAL LOW (ref 31.5–35.7)
MCV: 78 fL — ABNORMAL LOW (ref 79–97)
Monocytes Absolute: 0.4 10*3/uL (ref 0.1–0.9)
Monocytes: 7 %
Neutrophils Absolute: 3.5 10*3/uL (ref 1.4–7.0)
Neutrophils: 63 %
Platelets: 280 10*3/uL (ref 150–450)
RBC: 5.04 x10E6/uL (ref 3.77–5.28)
RDW: 15.1 % (ref 11.7–15.4)
WBC: 5.5 10*3/uL (ref 3.4–10.8)

## 2021-05-13 LAB — THYROID PANEL WITH TSH
Free Thyroxine Index: 2.4 (ref 1.2–4.9)
T3 Uptake Ratio: 31 % (ref 24–39)
T4, Total: 7.6 ug/dL (ref 4.5–12.0)
TSH: 0.517 u[IU]/mL (ref 0.450–4.500)

## 2021-05-15 ENCOUNTER — Other Ambulatory Visit: Payer: Self-pay | Admitting: Family Medicine

## 2021-05-15 DIAGNOSIS — R627 Adult failure to thrive: Secondary | ICD-10-CM

## 2021-05-15 DIAGNOSIS — Z8639 Personal history of other endocrine, nutritional and metabolic disease: Secondary | ICD-10-CM

## 2021-05-15 MED ORDER — BLOOD GLUCOSE MONITOR KIT: PACK | 0 refills | Status: AC

## 2021-05-15 NOTE — Addendum Note (Signed)
Addended by: Dorene Sorrow on: 05/15/2021 03:32 PM   Modules accepted: Orders

## 2021-05-16 ENCOUNTER — Telehealth: Payer: Self-pay | Admitting: Family Medicine

## 2021-05-31 ENCOUNTER — Encounter: Payer: Self-pay | Admitting: Family Medicine

## 2021-05-31 MED ORDER — LANCETS 30G MISC: 12 refills | Status: AC

## 2021-05-31 MED ORDER — ACCU-CHEK GUIDE VI STRP: ORAL_STRIP | 12 refills | Status: AC

## 2021-06-19 ENCOUNTER — Ambulatory Visit: Payer: Self-pay | Admitting: Nutrition

## 2021-07-04 ENCOUNTER — Ambulatory Visit: Payer: Self-pay | Admitting: Nutrition

## 2021-07-20 ENCOUNTER — Ambulatory Visit: Payer: Self-pay | Admitting: Nutrition

## 2021-10-18 ENCOUNTER — Other Ambulatory Visit: Payer: Self-pay

## 2021-10-18 ENCOUNTER — Ambulatory Visit (INDEPENDENT_AMBULATORY_CARE_PROVIDER_SITE_OTHER): Payer: Medicaid Other | Admitting: Nurse Practitioner

## 2021-10-18 ENCOUNTER — Encounter: Payer: Self-pay | Admitting: Nurse Practitioner

## 2021-10-18 VITALS — BP 105/73 | HR 80 | Temp 98.6°F | Resp 20 | Ht 61.0 in | Wt 104.0 lb

## 2021-10-18 DIAGNOSIS — J40 Bronchitis, not specified as acute or chronic: Secondary | ICD-10-CM

## 2021-10-18 MED ORDER — ALBUTEROL SULFATE HFA 108 (90 BASE) MCG/ACT IN AERS: 2.0000 | INHALATION_SPRAY | Freq: Four times a day (QID) | RESPIRATORY_TRACT | 0 refills | Status: AC | PRN

## 2021-10-18 MED ORDER — PREDNISONE 20 MG PO TABS
40.0000 mg | ORAL_TABLET | Freq: Every day | ORAL | 0 refills | Status: AC
Start: 2021-10-18 — End: 2021-10-23

## 2021-10-18 NOTE — Progress Notes (Signed)
Subjective:    Patient ID: Jessica Ferguson, female    DOB: [birth year], 30 y.o.   MRN: [id number]   Chief Complaint: Cough and Shortness of Breath   HPI Patient comes in today c/o sob and cough. Some wheezing. She does not smoke.  She is currently breast feeding  Review of Systems  Constitutional:  Negative for chills and fever.  HENT:  Negative for congestion, rhinorrhea, sinus pain and sore throat.   Respiratory:  Positive for cough, shortness of breath and wheezing.       Objective:   Physical Exam Vitals and nursing note reviewed.  Constitutional:      Appearance: She is well-developed.  HENT:     Right Ear: Tympanic membrane normal.     Left Ear: Tympanic membrane normal.     Nose: Nose normal.     Mouth/Throat:     Mouth: Mucous membranes are moist.  Eyes:     Extraocular Movements: Extraocular movements intact.     Pupils: Pupils are equal, round, and reactive to light.  Cardiovascular:     Rate and Rhythm: Normal rate and regular rhythm.     Heart sounds: Normal heart sounds.  Pulmonary:     Effort: Pulmonary effort is normal.     Breath sounds: Wheezing (faint exp wheezes bil bases) present. No rhonchi.  Skin:    General: Skin is warm and dry.  Neurological:     General: No focal deficit present.     Mental Status: She is alert and oriented to person, place, and time.  Psychiatric:        Mood and Affect: Mood normal.        Behavior: Behavior normal.    BP 105/73    Pulse 80    Temp 98.6 F (37 C) (Temporal)    Resp 20    Ht 5\' 1"  (1.549 m)    Wt 104 lb (47.2 kg)    SpO2 100%    BMI 19.65 kg/m        Assessment & Plan:  Jessica Ferguson in today with chief complaint of Cough and Shortness of Breath   1. Bronchitis 1. Take meds as prescribed 2. Use a cool mist humidifier especially during the winter months and when heat has been humid. 3. Use saline nose sprays frequently 4. Saline irrigations of the nose can be very helpful if done  frequently.  * 4X daily for 1 week*  * Use of a nettie pot can be helpful with this. Follow directions with this* 5. Drink plenty of fluids 6. Keep thermostat turn down low 7.For any cough or congestion- robitussin OTC 8. For fever or aces or pains- take tylenol or ibuprofen appropriate for age and weight.  * for fevers greater than 101 orally you may alternate ibuprofen and tylenol every  3 hours.   Meds ordered this encounter  Medications   predniSONE (DELTASONE) 20 MG tablet    Sig: Take 2 tablets (40 mg total) by mouth daily with breakfast for 5 days. 2 po daily for 5 days    Dispense:  10 tablet    Refill:  0    Order Specific Question:   Supervising Provider    Answer:   [birth year]1986 A [[id number]]   albuterol (VENTOLIN HFA) 108 (90 Base) MCG/ACT inhaler    Sig: Inhale 2 puffs into the lungs every 6 (six) hours as needed for wheezing or shortness of breath.    Dispense:  8  g    Refill:  0    Order Specific Question:   Supervising Provider    Answer:   Arville Care A [[id number]]       The above assessment and management plan was discussed with the patient. The patient verbalized understanding of and has agreed to the management plan. Patient is aware to call the clinic if symptoms persist or worsen. Patient is aware when to return to the clinic for a follow-up visit. Patient educated on when it is appropriate to go to the emergency department.   Mary-Margaret Daphine Deutscher, FNP

## 2021-10-18 NOTE — Patient Instructions (Signed)
Acute Bronchitis, Adult ?Acute bronchitis is when air tubes in the lungs (bronchi) suddenly get swollen. The condition can make it hard for you to breathe. In adults, acute bronchitis usually goes away within 2 weeks. A cough caused by bronchitis may last up to 3 weeks. Smoking, allergies, and asthma can make the condition worse. ?What are the causes? ?Germs that cause cold and flu (viruses). The most common cause of this condition is the virus that causes the common cold. ?Bacteria. ?Substances that bother (irritate) the lungs, including: ?Smoke from cigarettes and other types of tobacco. ?Dust and pollen. ?Fumes from chemicals, gases, or burned fuel. ?Indoor or outdoor air pollution. ?What increases the risk? ?A weak body's defense system. This is also called the immune system. ?Any condition that affects your lungs and breathing, such as asthma. ?What are the signs or symptoms? ?A cough. ?Coughing up clear, yellow, or green mucus. ?Making high-pitched whistling sounds when you breathe, most often when you breathe out (wheezing). ?Runny or stuffy nose. ?Having too much mucus in your lungs (chest congestion). ?Shortness of breath. ?Body aches. ?A sore throat. ?How is this treated? ?Acute bronchitis may go away over time without treatment. Your doctor may tell you to: ?Drink more fluids. This will help thin your mucus so it is easier to cough up. ?Use a device that gets medicine into your lungs (inhaler). ?Use a vaporizer or a humidifier. These are machines that add water to the air. This helps with coughing and poor breathing. ?Take a medicine that thins mucus and helps clear it from your lungs. ?Take a medicine that prevents or stops coughing. ?It is not common to take an antibiotic medicine for this condition. ?Follow these instructions at home: ? ?Take over-the-counter and prescription medicines only as told by your doctor. ?Use an inhaler, vaporizer, or humidifier as told by your doctor. ?Take two teaspoons (10  mL) of honey at bedtime. This helps lessen your coughing at night. ?Drink enough fluid to keep your pee (urine) pale yellow. ?Do not smoke or use any products that contain nicotine or tobacco. If you need help quitting, ask your doctor. ?Get a lot of rest. ?Return to your normal activities when your doctor says that it is safe. ?Keep all follow-up visits. ?How is this prevented? ? ?Wash your hands often with soap and water for at least 20 seconds. If you cannot use soap and water, use hand sanitizer. ?Avoid contact with people who have cold symptoms. ?Try not to touch your mouth, nose, or eyes with your hands. ?Avoid breathing in smoke or chemical fumes. ?Make sure to get the flu shot every year. ?Contact a doctor if: ?Your symptoms do not get better in 2 weeks. ?You have trouble coughing up the mucus. ?Your cough keeps you awake at night. ?You have a fever. ?Get help right away if: ?You cough up blood. ?You have chest pain. ?You have very bad shortness of breath. ?You faint or keep feeling like you are going to faint. ?You have a very bad headache. ?Your fever or chills get worse. ?These symptoms may be an emergency. Get help right away. Call your local emergency services (911 in the [place] ?Do not wait to see if the symptoms will go away. ?Do not drive yourself to the hospital. ?Summary ?Acute bronchitis is when air tubes in the lungs (bronchi) suddenly get swollen. In adults, acute bronchitis usually goes away within 2 weeks. ?Drink more fluids. This will help thin your mucus so it is easier to   cough up. ?Take over-the-counter and prescription medicines only as told by your doctor. ?Contact a doctor if your symptoms do not improve after 2 weeks of treatment. ?This information is not intended to replace advice given to you by your health care provider. Make sure you discuss any questions you have with your health care provider. ?Document Revised: 02/08/2021 Document Reviewed: 02/08/2021 ?Elsevier Patient Education  ? 2022 [employer] ? ?

## 2021-11-03 ENCOUNTER — Encounter: Payer: Self-pay | Admitting: Family Medicine

## 2021-11-06 ENCOUNTER — Other Ambulatory Visit: Payer: Self-pay

## 2021-11-06 ENCOUNTER — Ambulatory Visit (INDEPENDENT_AMBULATORY_CARE_PROVIDER_SITE_OTHER): Payer: Medicaid Other | Admitting: Family Medicine

## 2021-11-06 ENCOUNTER — Encounter: Payer: Self-pay | Admitting: Family Medicine

## 2021-11-06 VITALS — BP 139/77 | HR 88 | Temp 98.2°F | Ht 61.42 in | Wt 103.4 lb

## 2021-11-06 DIAGNOSIS — R42 Dizziness and giddiness: Secondary | ICD-10-CM

## 2021-11-06 DIAGNOSIS — E161 Other hypoglycemia: Secondary | ICD-10-CM

## 2021-11-06 DIAGNOSIS — E162 Hypoglycemia, unspecified: Secondary | ICD-10-CM | POA: Diagnosis not present

## 2021-11-06 DIAGNOSIS — Z7689 Persons encountering health services in other specified circumstances: Secondary | ICD-10-CM | POA: Diagnosis not present

## 2021-11-06 DIAGNOSIS — N926 Irregular menstruation, unspecified: Secondary | ICD-10-CM | POA: Diagnosis not present

## 2021-11-06 LAB — COMPREHENSIVE METABOLIC PANEL
ALT: 8 U/L (ref 0–35)
AST: 12 U/L (ref 0–37)
Albumin: 4.5 g/dL (ref 3.5–5.2)
Alkaline Phosphatase: 68 U/L (ref 39–117)
BUN: 15 mg/dL (ref 6–23)
CO2: 25 mEq/L (ref 19–32)
Calcium: 9.4 mg/dL (ref 8.4–10.5)
Chloride: 106 mEq/L (ref 96–112)
Creatinine, Ser: 0.6 mg/dL (ref 0.40–1.20)
GFR: 121.17 mL/min (ref >60.00)
Glucose, Bld: 81 mg/dL (ref 70–99)
Potassium: 4.2 mEq/L (ref 3.5–5.1)
Sodium: 141 mEq/L (ref 135–145)
Total Bilirubin: 0.7 mg/dL (ref 0.2–1.2)
Total Protein: 7.1 g/dL (ref 6.0–8.3)

## 2021-11-06 LAB — CBC WITH DIFFERENTIAL/PLATELET
Basophils Absolute: 0 10*3/uL (ref 0.0–0.1)
Basophils Relative: 0.6 % (ref 0.0–3.0)
Eosinophils Absolute: 0.1 10*3/uL (ref 0.0–0.7)
Eosinophils Relative: 1.6 % (ref 0.0–5.0)
HCT: 40.2 % (ref 36.0–46.0)
Hemoglobin: 12.8 g/dL (ref 12.0–15.0)
Lymphocytes Relative: 24.3 % (ref 12.0–46.0)
Lymphs Abs: 1.2 10*3/uL (ref 0.7–4.0)
MCHC: 31.9 g/dL (ref 30.0–36.0)
MCV: 84.2 fl (ref 78.0–100.0)
Monocytes Absolute: 0.3 10*3/uL (ref 0.1–1.0)
Monocytes Relative: 6.5 % (ref 3.0–12.0)
Neutro Abs: 3.3 10*3/uL (ref 1.4–7.7)
Neutrophils Relative %: 67 % (ref 43.0–77.0)
Platelets: 189 10*3/uL (ref 150.0–400.0)
RBC: 4.78 Mil/uL (ref 3.87–5.11)
RDW: 14.5 % (ref 11.5–15.5)
WBC: 5 10*3/uL (ref 4.0–10.5)

## 2021-11-06 LAB — POCT GLYCOSYLATED HEMOGLOBIN (HGB A1C)
HbA1c POC (<> result, manual entry): 5.2 % (ref 4.0–5.6)
HbA1c, POC (controlled diabetic range): 5.2 % (ref 0.0–7.0)
HbA1c, POC (prediabetic range): 5.2 % — AB (ref 5.7–6.4)
Hemoglobin A1C: 5.2 % (ref 4.0–5.6)

## 2021-11-06 LAB — TSH: TSH: 0.31 u[IU]/mL — ABNORMAL LOW (ref 0.35–5.50)

## 2021-11-06 NOTE — Patient Instructions (Addendum)
°  Great to see you today.  I have refilled the medication(s) we provide.   If labs were collected, we will inform you of lab results once received either by echart message or telephone call.   - echart message- for normal results that have been seen by the patient already.   - telephone call: abnormal results or if patient has not viewed results in their echart.  It sounds like you are have "postprandial hypoglycemia"  We will complete some labs for you today and when we receive the results we will discuss next plan.

## 2021-11-06 NOTE — Progress Notes (Signed)
Pt is fasting. Periods have became longer since having tube tied. Still breastfeeding.

## 2021-11-06 NOTE — Progress Notes (Signed)
Patient ID: Jessica Ferguson, female  DOB: 11/27/1990, 31 y.o.   MRN: [id number] Patient Care Team    Relationship Specialty Notifications Start End  Ma Hillock, DO PCP - General Family Medicine  11/06/21   [place], Elta Guadeloupe, MD Referring Physician Gastroenterology  11/06/21   Jeannie Fend, MD  Neurology  11/06/21   Jonna Clark, MD Referring Physician Obstetrics and Gynecology  11/06/21     Chief Complaint  Patient presents with   Establish Care   Diabetes    Pt fasting     Subjective: Jessica Ferguson is a 31 y.o.  female present for new patient establishment/CMC. All past medical history, surgical history, allergies, family history, immunizations, medications and social history were updated in the electronic medical record today. All recent labs, ED visits and hospitalizations within the last year were reviewed.  Patient is present to establish today and states she would like to discuss her "diabetes. "She has not been formally diagnosed with diabetes.  She does monitor her sugars frequently.  And she reports they range between 69-142.  She reports the blood glucose level was 69 was when she was waking up and she felt horrible.  She ate, and then felt better after.  She has a family history of type 1 diabetes and type 2 diabetes and is very concerned her sugars are abnormal.  She states her main complaint is that she gets dizzy about 30 minutes after eating.  She states her sugars can drop during this time.  She drinks over a gallon of water a day.  She has been lifting weights to get back in shape and takes in about 2500 to 3000 cal a day.  She is also breast-feeding.   Depression screen [facility] 2/9 05/12/2021 01/13/2020 09/07/2019 02/25/2019 11/28/2017  Decreased Interest 0 0 0 0 0  Down, Depressed, Hopeless 0 0 0 0 0  PHQ - 2 Score 0 0 0 0 0   GAD 7 : Generalized Anxiety Score 05/12/2021  Nervous, Anxious, on Edge 0  Control/stop worrying 0  Worry too much - different things 0   Trouble relaxing 0  Restless 0  Easily annoyed or irritable 0  Afraid - awful might happen 0  Total GAD 7 Score 0  Anxiety Difficulty Not difficult at all       Fall Risk  05/12/2021 01/13/2020 11/28/2017 07/30/2017 10/09/2016  Falls in the past year? 0 0 No No Yes  Comment - - - - -  Number falls in past yr: - - - - 1  Injury with Fall? - - - - No   Immunization History  Administered Date(s) Administered   MMR 04/01/2021, 05/12/2021   Rho (D) Immune Globulin 05/21/2017, 03/23/2021   Tdap 01/20/2010, 03/14/2017, 03/02/2021   Varicella 04/01/2021, 05/12/2021   No results found.  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    Education/employment: HS grad.    Safety:      -Wears a bicycle helmet riding a bike: Yes     -smoke alarm in the home:Yes     - wears seatbelt: Yes     - Feels safe in their relationships: Yes       Allergies as of 11/06/2021       Reactions   Aspirin Nausea And Vomiting   Bee Venom    Celery Oil Other (See Comments)   migraine        Medication List        Accurate as of November 06, 2021  9:34 AM. If you have any questions, ask your nurse or doctor.          Accu-Chek Guide test strip Generic drug: glucose blood Use as instructed   albuterol 108 (90 Base) MCG/ACT inhaler Commonly known as: VENTOLIN HFA Inhale 2 puffs into the lungs every 6 (six)  hours as needed for wheezing or shortness of breath.   blood glucose meter kit and supplies Kit Dispense based on patient and insurance preference. Use up to four times daily as directed.   Lancets 30G Misc Check blood sugars twice daily        All past medical history, surgical history, allergies, family history, immunizations andmedications were updated in the EMR today and reviewed under the history and medication portions of their EMR.    No results found for this or any previous visit (from the past 2160 hour(s)).  MR Brain W Wo Contrast   ROS 14 pt review of systems performed and negative (unless mentioned in an HPI)  Objective:  BP 139/77    Pulse 88    Temp 98.2 F (36.8 C)    Ht 5' 1.42" (1.56 m)    Wt 103 lb 6.4 oz (46.9 kg)    LMP 11/01/2021    SpO2 100%    BMI 19.27 kg/m   Physical Exam Vitals and nursing note reviewed.  Constitutional:      General: She is not in acute distress.    Appearance: Normal appearance. She is not ill-appearing, toxic-appearing or diaphoretic.     Comments: Thin.  HENT:     Head: Normocephalic and atraumatic.  Eyes:     General: No scleral icterus.       Right eye: No discharge.        Left eye: No discharge.     Extraocular Movements: Extraocular movements intact.     Conjunctiva/sclera: Conjunctivae normal.     Pupils: Pupils are equal, round, and reactive to light.  Cardiovascular:     Rate and Rhythm: Normal rate and regular rhythm.  Pulmonary:     Effort: Pulmonary effort is normal. No respiratory distress.     Breath sounds: Normal breath sounds. No wheezing, rhonchi or rales.  Musculoskeletal:     Cervical back: Neck supple. No tenderness.  Lymphadenopathy:     Cervical: No cervical adenopathy.  Skin:    General: Skin is warm and dry.     Coloration: Skin is not jaundiced or pale.     Findings: No erythema or rash.  Neurological:     Mental Status: She is alert and oriented to person, place, and time. Mental status  is at baseline.     Motor: No weakness.     Gait: Gait normal.  Psychiatric:        Mood and Affect: Mood normal.        Behavior: Behavior normal.        Thought Content: Thought content normal.        Judgment: Judgment normal.     Assessment/plan: Jessica Ferguson is a 31 y.o. female present for est care/acute Establishing care with new doctor, encounter for Dizziness Dizziness seems to be mostly occurring after meals with reported drops in her sugar at this time.  Lowest reported sugar is only 69, would not think she would have much in the way of symptoms with that result.  The highest is 142 after meals. - POCT glycosylated hemoglobin (Hb A1C)> 5.2 today, reviewed prior labs in EMR FBG was 84.  - Comp Met (CMET) - CBC w/Diff - TSH - Cardio IQ Insulin - Iron, TIBC and Ferritin Panel We discussed other causes of dizziness today.  Including electrolyte disturbances, anemia, iron deficiency.  Hypoglycemia/?  Postprandial She endorses her symptoms are mostly after a meal, there may be a postprandial hypoglycemia present.  She has a history of "convulsion disorder "wondering if there is a correlation with her sugar during these events. Her A1c is normal today. - Cardio IQ Insulin Encouraged her to eat 3 meals a day with protein snack between meals. Could consider endocrine referral  Menstrual changes She also endorses her menstrual cycle lasting a week the last 2 cycles, where before it would only last 4 days.  She is concerned over these changes. Assured her today that a menstrual cycle of 7 days is considered normal, although that might not be her normal prior. We are checking TSH today.   No follow-ups on file.  No orders of the defined types were placed in this encounter.  No orders of the defined types were placed in this encounter.  Referral Orders  No referral(s) requested today     Note is dictated utilizing voice recognition software. Although note has been proof  read prior to signing, occasional typographical errors still can be missed. If any questions arise, please do not hesitate to call for verification.  Electronically signed by: Howard Pouch, DO [place]

## 2021-11-07 ENCOUNTER — Telehealth: Payer: Self-pay | Admitting: Family Medicine

## 2021-11-07 DIAGNOSIS — R7989 Other specified abnormal findings of blood chemistry: Secondary | ICD-10-CM

## 2021-11-07 DIAGNOSIS — R42 Dizziness and giddiness: Secondary | ICD-10-CM

## 2021-11-07 DIAGNOSIS — N926 Irregular menstruation, unspecified: Secondary | ICD-10-CM

## 2021-11-07 LAB — IRON,TIBC AND FERRITIN PANEL
%SAT: 28 % (calc) (ref 16–45)
Ferritin: 16 ng/mL (ref 16–154)
Iron: 99 ug/dL (ref 40–190)
TIBC: 360 mcg/dL (calc) (ref 250–450)

## 2021-11-07 NOTE — Telephone Encounter (Signed)
Please inform patient the following information: Her iron panel is normal.  Liver and kidney fxs are normal.  Blood cell counts are normal and so are her electrolytes Thyroid level appears mildly overactive.     > I have referred her to endocrine to address. This will need to be retested with additional labs to ensure her thyroid is functioning normal.   Her insulin panel is still pending. We will call her with results once available.

## 2021-11-07 NOTE — Telephone Encounter (Signed)
Spoke with pt regarding results/recommendations,voiced understanding. ? ?

## 2021-11-08 ENCOUNTER — Telehealth: Payer: Self-pay | Admitting: Family Medicine

## 2021-11-08 NOTE — Telephone Encounter (Signed)
Please advise 

## 2021-11-08 NOTE — Telephone Encounter (Signed)
Rest, I would encourage her to avoid consuming such high sugar containing foods and drinks.     -181 after eating a brownie and drinking sweet tea is a normal reaction from her body. - Again, she should really stick to lean meats, fruits and vegetables, along with plenty hydration from water and sugar-free drinks as we discussed yesterday during her appointment.

## 2021-11-08 NOTE — Telephone Encounter (Signed)
Pt called said her blood sugar was 116. After eating a brownie & sweet tea her blood sugar went up to 181. She said she was sweating.   Pt wants to know is this normal, she's never had her blood sugar that high.--KR  Pt cell: [phone]

## 2021-11-09 LAB — CARDIO IQ INSULIN: Insulin: 2.9 u[IU]/mL (ref <19.7)

## 2021-11-09 NOTE — Telephone Encounter (Signed)
LM for pt to return call regarding results.  

## 2021-11-09 NOTE — Telephone Encounter (Signed)
Pt states that her tea was not sweet. After a while she crashed and checked her sugar and it was below 80. She states that she does not have an appetite and is not thirsty either today. All she wants to do is sleep like she did yesterday.

## 2021-11-09 NOTE — Telephone Encounter (Signed)
Noted.  Her symptoms are not caused by a dysregulation her sugar. 80 glucose is normal. 180 glucose is expected after consuming sugary snacks.   Symptoms could be related to her thyroid, and she has been referred to endocrine for further eval., Please make sure they have received referral and are scheduling her quickly since her symptoms are so pronounced.  If [facility] can not get her in quick, please change to external referral.

## 2021-11-09 NOTE — Telephone Encounter (Signed)
MyChart message sent with recommendations.

## 2021-12-08 ENCOUNTER — Other Ambulatory Visit: Payer: Self-pay

## 2021-12-08 ENCOUNTER — Encounter: Payer: Self-pay | Admitting: Internal Medicine

## 2021-12-08 ENCOUNTER — Ambulatory Visit (INDEPENDENT_AMBULATORY_CARE_PROVIDER_SITE_OTHER): Payer: Medicaid Other | Admitting: Internal Medicine

## 2021-12-08 VITALS — BP 134/72 | HR 73 | Ht 61.42 in | Wt 103.8 lb

## 2021-12-08 DIAGNOSIS — E0590 Thyrotoxicosis, unspecified without thyrotoxic crisis or storm: Secondary | ICD-10-CM

## 2021-12-08 DIAGNOSIS — R42 Dizziness and giddiness: Secondary | ICD-10-CM

## 2021-12-08 LAB — T4, FREE: Free T4: 1.15 ng/dL (ref 0.60–1.60)

## 2021-12-08 LAB — TSH: TSH: 0.51 u[IU]/mL (ref 0.35–5.50)

## 2021-12-08 MED ORDER — METHIMAZOLE 5 MG PO TABS: 2.5000 mg | ORAL_TABLET | Freq: Every day | ORAL | 3 refills | Status: DC

## 2021-12-08 MED ORDER — ATENOLOL 25 MG PO TABS: 25.0000 mg | ORAL_TABLET | Freq: Every day | ORAL | 6 refills | Status: DC

## 2021-12-08 NOTE — Patient Instructions (Addendum)
-   Start methimazole 5 mg , half a tablet daily

## 2021-12-08 NOTE — Progress Notes (Signed)
Name: Jessica Ferguson  MRN/ DOB: [id number], 07-Jul-1991    Age/ Sex: 31 y.o., female    PCP: Ma Hillock, DO   Reason for Endocrinology Evaluation: Subclinical hyperthyroidism     Date of Initial Endocrinology Evaluation: 12/08/2021     HPI: Jessica Ferguson is a 31 y.o. female with a past medical history of migraine headaches . The patient presented for initial endocrinology clinic visit on 12/08/2021 for consultative assistance with her subclinical hyperthyroidism.   Pt was noted to have a low TSH at 0.31 on 11/06/2021, this was done during evaluation for postprandial dizziness and subjective hypoglycemia with low of 69 mg/dL as well as heavy menstruations.    Weight has been stable  Had bronchitis around christmas's time She has noted local neck swelling after she was told her thyroid is abnormal.  She continues to have local neck symptoms, she presented to the ED on 11/22/2021, repeat TSH showed slight improvement at 0.40 uIU/mL with normal free T4 at 1.24.  A CT scan of the neck showed borderline thyroid enlargement with 5 mm cyst in the lateral left lobe.   Denies diarrhea or loose stools  She has occasional palpitations  She is stressed out with 2 kids but denies anxiety she has an 15-monthold and an 820-monthld patient is nursing  She is not on Biotin    Maternal aunts and uncles with thyroid  disease   She is under the impression that she has diabetes , she describes severe symptoms of dizziness not feeling well with a BG of 6932g/DL   Has father with DM   HISTORY:  Past Medical History:  Past Medical History:  Diagnosis Date   Anal fissure    Convulsion disorder ([facility]   Hemorrhoid    Migraine    Pain in joint of right shoulder 07/01/2016   Past Surgical History:  Past Surgical History:  Procedure Laterality Date   CESAREAN SECTION  2022   TUBAL LIGATION      Social History:  reports that she has never smoked. She has never been exposed to tobacco  smoke. She has never used smokeless tobacco. She reports that she does not drink alcohol and does not use drugs. Family History: family history includes COPD in her maternal grandmother; Diabetes in her paternal grandfather.   HOME MEDICATIONS: Allergies as of 12/08/2021       Reactions   Aspirin Nausea And Vomiting   Bee Venom    Celery Oil Other (See Comments)   migraine        Medication List        Accurate as of December 08, 2021 12:19 PM. If you have any questions, ask your nurse or doctor.          Accu-Chek Guide test strip Generic drug: glucose blood Use as instructed   albuterol 108 (90 Base) MCG/ACT inhaler Commonly known as: VENTOLIN HFA Inhale 2 puffs into the lungs every 6 (six) hours as needed for wheezing or shortness of breath.   blood glucose meter kit and supplies Kit Dispense based on patient and insurance preference. Use up to four times daily as directed.   Lancets 30G Misc Check blood sugars twice daily   methimazole 5 MG tablet Commonly known as: TAPAZOLE Take 0.5 tablets (2.5 mg total) by mouth daily. Started by: IbDorita SciaraMD          REVIEW OF SYSTEMS: A comprehensive ROS was conducted with the  patient and is negative except as per HPI     OBJECTIVE:  VS: BP 134/72 (BP Location: Left Arm, Patient Position: Sitting, Cuff Size: Small)    Pulse 73    Ht 5' 1.42" (1.56 m)    Wt 103 lb 12.8 oz (47.1 kg)    SpO2 99%    BMI 19.35 kg/m    Wt Readings from Last 3 Encounters:  12/08/21 103 lb 12.8 oz (47.1 kg)  11/06/21 103 lb 6.4 oz (46.9 kg)  10/18/21 104 lb (47.2 kg)     EXAM: General: Pt appears well and is in NAD  Eyes: External eye exam normal without stare, lid lag or exophthalmos.  EOM intact.    Neck: General: Supple without adenopathy. Thyroid: Thyroid size prominent. no  nodules appreciated. No thyroid bruit.  Tenderness noted on exam  Lungs: Clear with good BS bilat with no rales, rhonchi, or wheezes   Heart: Auscultation: RRR.  Abdomen: Normoactive bowel sounds, soft, nontender, without masses or organomegaly palpable  Extremities:  BL LE: No pretibial edema normal ROM and strength.  Mental Status: Judgment, insight: Intact Orientation: Oriented to time, place, and person Mood and affect: No depression, anxiety, or agitation     DATA REVIEWED:    Latest Reference Range & Units 12/08/21 09:38  TSH 0.35 - 5.50 uIU/mL 0.51  T4,Free(Direct) 0.60 - 1.60 ng/dL 1.15    CT neck 11/22/2021 1. The thyroid gland is borderline enlarged, but predominantly homogeneous. This is nonspecific. Small (approximately 5 mm) cyst in the lateral left lobe.   2. Otherwise, normal neck CT with contrast.    ASSESSMENT/PLAN/RECOMMENDATIONS:   Subclinical hyperthyroidism:  -Patient with multiple nonspecific symptoms -Discussed differential diagnosis of subacute thyroiditis versus Graves' versus autonomous thyroid nodule -We will proceed with TRAb - We initially discussed starting methimazole but her TFT's normal and will cancel this order  -Given that her TFTs have resolved I suspect subacute thyroiditis related to viral infection in December 2022 -Patient may use NSAIDs alternating with Tylenol as needed for neck pain  Medications : N/A   2.  Subjective hypoglycemia/dizziness :   -The patient has labeled herself as" having diabetes".  Today we reviewed the criteria to diagnose diabetes mellitus and she does not qualify for this diagnosis. -She is also under the impression that hypoglycemia is part of the condition, I did explain to the patient that hypoglycemia in the setting of glycemic agents is defined as a BG of less than 70 mg/DL, but in people who do not have a diagnosis of diabetes and who are not on any glycemic agents BG readings in the 60s and upper 50s is considered normal. -Evaluation for hypoglycemia starts with a BG reading of< 55 mg/dL  -I have advised the patient to read the [employer] website to educate herself , as it appears her source of information has been coming from family members. -We discussed differential diagnosis of dizziness to include neurological, cardiology causes as well as anxiety/panic attacks  Follow-up in 3 months Labs in 6 weeks    Addendum: Lab results discussed with the patient on 12/08/2021 Signed electronically by: Mack Guise, MD  [facility]  [facility] [address] [place], [place] [address] Phone: [phone] FAX: [phone]   CC: Ma Hillock, DO [address] [place] [address] Phone: [phone] Fax: [phone]   Return to Endocrinology clinic as below: Future Appointments  Date Time Provider [place]  01/19/2022  1:45 PM [facility] None  03/09/2022  1:40 PM Ismael Treptow, Melanie Crazier, MD [facility] None  04/20/2022  3:30 PM Kuneff, Renee A, DO [facility]

## 2021-12-11 LAB — T3: T3, Total: 119 ng/dL (ref 76–181)

## 2021-12-11 LAB — TRAB (TSH RECEPTOR BINDING ANTIBODY): TRAB: 1.69 IU/L (ref <=2.00)

## 2021-12-25 ENCOUNTER — Telehealth: Payer: Self-pay | Admitting: Family Medicine

## 2021-12-25 NOTE — Telephone Encounter (Signed)
Patient scheduled to see Rakes, FNP 12/29/2021 at 2:05pm ?

## 2021-12-29 ENCOUNTER — Ambulatory Visit: Payer: Medicaid Other | Admitting: Family Medicine

## 2021-12-29 ENCOUNTER — Encounter: Payer: Self-pay | Admitting: Family Medicine

## 2021-12-29 VITALS — BP 137/89 | HR 90 | Temp 97.8°F | Resp 20 | Ht 61.0 in | Wt 105.0 lb

## 2021-12-29 DIAGNOSIS — R7309 Other abnormal glucose: Secondary | ICD-10-CM

## 2021-12-29 DIAGNOSIS — E0590 Thyrotoxicosis, unspecified without thyrotoxic crisis or storm: Secondary | ICD-10-CM

## 2021-12-29 DIAGNOSIS — E161 Other hypoglycemia: Secondary | ICD-10-CM

## 2021-12-29 NOTE — Progress Notes (Signed)
Subjective:  Patient ID: Jessica Ferguson, female    DOB: 1991-02-03, 31 y.o.   MRN: [id number]  Patient Care Team: Sonny Masters, FNP as PCP - General (Family Medicine) [place], Diamond Nickel, MD as Referring Physician (Gastroenterology) Claretta Fraise, MD (Neurology) Ailene Ards, MD as Referring Physician (Obstetrics and Gynecology)   Chief Complaint:  Hyperglycemia   HPI: Jessica Ferguson is a 31 y.o. female presenting on 12/29/2021 for Hyperglycemia   Patient presents today for reevaluation of abnormal blood sugars.  She has been seen multiple times for same complaint.  A1c normal.  Reports blood sugars around 68-110 in the mornings and this is very concerning to her.  Long discussion about normal blood sugar ranges and variations in blood sugar throughout the day.  Patient denies any weakness, confusion, dizziness, blurred vision, diaphoresis, or syncope.  No loss of bowel or bladder.  She is currently seeing endocrinology for hyperthyroidism.  States she is no longer taking medications for this.  She does not wish to go back to same endocrinologist would like referral to someone new.    Relevant past medical, surgical, family, and social history reviewed and updated as indicated.  Allergies and medications reviewed and updated. Data reviewed: Chart in Epic.   Past Medical History:  Diagnosis Date   Anal fissure    Convulsion disorder (H[facility])    Hemorrhoid    Migraine    Pain in joint of right shoulder 07/01/2016    Past Surgical History:  Procedure Laterality Date   CESAREAN SECTION  2022   TUBAL LIGATION      Social History   Socioeconomic History   Marital status: Married    Spouse name: Reuel Boom   Number of children: 0   Years of education: 12   Highest education level: Not on file  Occupational History    Comment: [employer]  Tobacco Use   Smoking status: Never    Passive exposure: Never   Smokeless tobacco: Never  Vaping Use   Vaping Use: Never used   Substance and Sexual Activity   Alcohol use: No    Comment: rare   Drug use: Never   Sexual activity: Yes    Partners: Male    Birth control/protection: Surgical    Comment: BTL  Other Topics Concern   Not on file  Social History Narrative   Marital status/children/pets: Married, Lives with husband and 2 children.    Education/employment: HS grad.    Safety:      -Wears a bicycle helmet riding a bike: Yes     -smoke alarm in the home:Yes     - wears seatbelt: Yes     - Feels safe in their relationships: Yes      Social Determinants of Health   Financial Resource Strain: Not on file  Food Insecurity: Not on file  Transportation Needs: Not on file  Physical Activity: Not on file  Stress: Not on file  Social Connections: Not on file  Intimate Partner Violence: Not on file    Outpatient Encounter Medications as of 12/29/2021  Medication Sig   albuterol (VENTOLIN HFA) 108 (90 Base) MCG/ACT inhaler Inhale 2 puffs into the lungs every 6 (six) hours as needed for wheezing or shortness of breath.   blood glucose meter kit and supplies KIT Dispense based on patient and insurance preference. Use up to four times daily as directed.   glucose blood (ACCU-CHEK GUIDE) test strip Use as instructed   Lancets  30G MISC Check blood sugars twice daily   Multiple Vitamin (MULTIVITAMIN) tablet Take 1 tablet by mouth daily.   [DISCONTINUED] methimazole (TAPAZOLE) 5 MG tablet Take 0.5 tablets (2.5 mg total) by mouth daily. (Patient not taking: Reported on 12/29/2021)   No facility-administered encounter medications on file as of 12/29/2021.    Allergies  Allergen Reactions   Aspirin Nausea And Vomiting   Bee Venom    Celery Oil Other (See Comments)    migraine    Review of Systems  Constitutional:  Negative for activity change, appetite change, chills, diaphoresis, fatigue, fever and unexpected weight change.  HENT: Negative.    Eyes: Negative.  Negative for photophobia and visual  disturbance.  Respiratory:  Negative for cough, chest tightness and shortness of breath.   Cardiovascular:  Negative for chest pain, palpitations and leg swelling.  Gastrointestinal:  Negative for abdominal pain, blood in stool, constipation, diarrhea, nausea and vomiting.  Endocrine: Negative.  Negative for cold intolerance, heat intolerance, polydipsia, polyphagia and polyuria.  Genitourinary:  Negative for decreased urine volume, difficulty urinating, dysuria, frequency and urgency.  Musculoskeletal:  Negative for arthralgias and myalgias.  Skin: Negative.   Allergic/Immunologic: Negative.   Neurological:  Negative for dizziness, tremors, seizures, syncope, facial asymmetry, speech difficulty, weakness, light-headedness, numbness and headaches.  Hematological: Negative.   Psychiatric/Behavioral:  Negative for confusion, hallucinations, sleep disturbance and suicidal ideas.   All other systems reviewed and are negative.      Objective:  BP 137/89   Pulse 90   Temp 97.8 F (36.6 C) (Oral)   Resp 20   Ht 5\' 1"  (1.549 m)   Wt 105 lb (47.6 kg)   BMI 19.84 kg/m    Wt Readings from Last 3 Encounters:  12/29/21 105 lb (47.6 kg)  12/08/21 103 lb 12.8 oz (47.1 kg)  11/06/21 103 lb 6.4 oz (46.9 kg)    Physical Exam Vitals and nursing note reviewed.  Constitutional:      General: She is not in acute distress.    Appearance: Normal appearance. She is well-developed, well-groomed and normal weight. She is not ill-appearing, toxic-appearing or diaphoretic.  HENT:     Head: Normocephalic and atraumatic.     Jaw: There is normal jaw occlusion.     Right Ear: Hearing normal.     Left Ear: Hearing normal.     Nose: Nose normal.     Mouth/Throat:     Lips: Pink.     Mouth: Mucous membranes are moist.     Pharynx: Oropharynx is clear. Uvula midline.  Eyes:     General: Lids are normal.     Extraocular Movements: Extraocular movements intact.     Conjunctiva/sclera: Conjunctivae  normal.     Pupils: Pupils are equal, round, and reactive to light.  Neck:     Thyroid: No thyroid mass, thyromegaly or thyroid tenderness.     Vascular: No carotid bruit or JVD.     Trachea: Trachea and phonation normal.  Cardiovascular:     Rate and Rhythm: Normal rate and regular rhythm.     Chest Wall: PMI is not displaced.     Pulses: Normal pulses.     Heart sounds: Normal heart sounds. No murmur heard.   No friction rub. No gallop.  Pulmonary:     Effort: Pulmonary effort is normal. No respiratory distress.     Breath sounds: Normal breath sounds. No wheezing.  Abdominal:     General: Bowel sounds are normal. There is  no distension or abdominal bruit.     Palpations: Abdomen is soft. There is no hepatomegaly or splenomegaly.     Tenderness: There is no abdominal tenderness. There is no right CVA tenderness or left CVA tenderness.     Hernia: No hernia is present.  Musculoskeletal:        General: Normal range of motion.     Cervical back: Normal range of motion and neck supple.     Right lower leg: No edema.     Left lower leg: No edema.  Lymphadenopathy:     Cervical: No cervical adenopathy.  Skin:    General: Skin is warm and dry.     Capillary Refill: Capillary refill takes less than 2 seconds.     Coloration: Skin is not cyanotic, jaundiced or pale.     Findings: No rash.  Neurological:     General: No focal deficit present.     Mental Status: She is alert and oriented to person, place, and time.     Sensory: Sensation is intact.     Motor: Motor function is intact.     Coordination: Coordination is intact.     Gait: Gait is intact.     Deep Tendon Reflexes: Reflexes are normal and symmetric.  Psychiatric:        Attention and Perception: Attention and perception normal.        Mood and Affect: Mood and affect normal.        Speech: Speech normal.        Behavior: Behavior normal. Behavior is cooperative.        Thought Content: Thought content normal.         Cognition and Memory: Cognition and memory normal.        Judgment: Judgment normal.    Results for orders placed or performed in visit on 12/08/21  TRAb (TSH Receptor Binding Antibody)  Result Value Ref Range   TRAB 1.69 <=2.00 IU/L  T3  Result Value Ref Range   T3, Total 119 76 - 181 ng/dL  T4, free  Result Value Ref Range   Free T4 1.15 0.60 - 1.60 ng/dL  TSH  Result Value Ref Range   TSH 0.51 0.35 - 5.50 uIU/mL       Pertinent labs & imaging results that were available during my care of the patient were reviewed by me and considered in my medical decision making.  Assessment & Plan:  Silva was seen today for hyperglycemia.  Diagnoses and all orders for this visit:  Abnormal blood sugar Postprandial hypoglycemia Hyperthyroidism Recent labs revealed normal blood sugar and normal A1c.  Long discussion about varying blood sugars throughout the day and symptoms which warrant further investigation.  Patient aware if she is asymptomatic, do not check blood sugar on a regular basis.  Will place referral to new endocrinologist. -   Ambulatory referral to Endocrinology     Continue all other maintenance medications.  Follow up plan: Return if symptoms worsen or fail to improve.   Continue healthy lifestyle choices, including diet (rich in fruits, vegetables, and lean proteins, and low in salt and simple carbohydrates) and exercise (at least 30 minutes of moderate physical activity daily).  The above assessment and management plan was discussed with the patient. The patient verbalized understanding of and has agreed to the management plan. Patient is aware to call the clinic if they develop any new symptoms or if symptoms persist or worsen. Patient is aware when to  return to the clinic for a follow-up visit. Patient educated on when it is appropriate to go to the emergency department.   Kari Baars, FNP-C [facility] [phone]

## 2022-01-19 ENCOUNTER — Other Ambulatory Visit: Payer: Medicaid Other

## 2022-03-09 ENCOUNTER — Ambulatory Visit: Payer: Medicaid Other | Admitting: Internal Medicine

## 2022-04-20 ENCOUNTER — Ambulatory Visit: Payer: Medicaid Other | Admitting: Family Medicine

## 2022-05-21 ENCOUNTER — Encounter: Payer: Self-pay | Admitting: Family Medicine

## 2022-05-21 ENCOUNTER — Ambulatory Visit (INDEPENDENT_AMBULATORY_CARE_PROVIDER_SITE_OTHER): Payer: Medicaid Other | Admitting: Family Medicine

## 2022-05-21 VITALS — BP 109/70 | HR 71 | Temp 98.2°F | Ht 61.0 in | Wt 107.0 lb

## 2022-05-21 DIAGNOSIS — N898 Other specified noninflammatory disorders of vagina: Secondary | ICD-10-CM

## 2022-05-21 DIAGNOSIS — N730 Acute parametritis and pelvic cellulitis: Secondary | ICD-10-CM | POA: Diagnosis not present

## 2022-05-21 DIAGNOSIS — N921 Excessive and frequent menstruation with irregular cycle: Secondary | ICD-10-CM | POA: Diagnosis not present

## 2022-05-21 LAB — WET PREP FOR TRICH, YEAST, CLUE
Clue Cell Exam: NEGATIVE
Trichomonas Exam: NEGATIVE
Yeast Exam: NEGATIVE

## 2022-05-21 MED ORDER — KETOROLAC TROMETHAMINE 60 MG/2ML IM SOLN
60.0000 mg | Freq: Once | INTRAMUSCULAR | Status: AC
  Administered 2022-05-21: 60 mg via INTRAMUSCULAR

## 2022-05-21 MED ORDER — DOXYCYCLINE HYCLATE 100 MG PO CAPS: 100.0000 mg | ORAL_CAPSULE | Freq: Two times a day (BID) | ORAL | 0 refills | Status: AC

## 2022-05-21 NOTE — Progress Notes (Signed)
Subjective:  Patient ID: Jessica Ferguson, female    DOB: [birth year]  Age: 31 y.o. MRN: [id number]  CC: Menstrual Problem   HPI ROLAND PRINE presents for spotting starting on July 12. Getting  BIT HEAVIER, BUT NO MENSES. Normal menses sshould have started on 7/28. Menses irregular for 9 months. Started spotting a few days before the menses. Menses come 4-6 weeks apart. Last 4-7 days. Had a tubal ligation. Has had miscarriages X 4. Has 2 living children. Married. Husband remains faithful. She hasn't had any other partners. Has a routine discharge since her youth that hasn't changed at all. She denies any odor.      12/29/2021    2:14 PM 11/06/2021    9:38 AM 05/12/2021   11:17 AM  Depression screen PHQ 2/9  Decreased Interest 0 0 0  Down, Depressed, Hopeless 0 0 0  PHQ - 2 Score 0 0 0  Altered sleeping 0    Tired, decreased energy 0    Change in appetite 0    Feeling bad or failure about yourself  0    Trouble concentrating 0    Moving slowly or fidgety/restless 0    Suicidal thoughts 0    PHQ-9 Score 0      History Honi has a past medical history of Anal fissure, Convulsion disorder ([place]), Hemorrhoid, Migraine, and Pain in joint of right shoulder (07/01/2016).   She has a past surgical history that includes Cesarean section (2022) and Tubal ligation.   Her family history includes COPD in her maternal grandmother; Diabetes in her paternal grandfather.She reports that she has never smoked. She has never been exposed to tobacco smoke. She has never used smokeless tobacco. She reports that she does not drink alcohol and does not use drugs.    ROS Review of Systems  Constitutional: Negative.   HENT: Negative.    Eyes:  Negative for visual disturbance.  Respiratory:  Negative for shortness of breath.   Cardiovascular:  Negative for chest pain.  Gastrointestinal:  Positive for abdominal pain (R suprapubic to RLQ).  Musculoskeletal:  Negative for arthralgias.    Objective:   BP 109/70   Pulse 71   Temp 98.2 F (36.8 C)   Ht _0  (1.549 m)   Wt 107 lb (48.5 kg)   SpO2 99%   BMI 20.22 kg/m   BP Readings from Last 3 Encounters:  05/21/22 109/70  12/29/21 137/89  12/08/21 134/72    Wt Readings from Last 3 Encounters:  05/21/22 107 lb (48.5 kg)  12/29/21 105 lb (47.6 kg)  12/08/21 103 lb 12.8 oz (47.1 kg)     Physical Exam Exam conducted with a chaperone present.  Constitutional:      General: She is not in acute distress.    Appearance: She is well-developed.  Cardiovascular:     Rate and Rhythm: Normal rate and regular rhythm.  Pulmonary:     Breath sounds: Normal breath sounds.  Genitourinary:    General: Normal vulva.     Exam position: Lithotomy position.     Pubic Area: No rash or pubic lice.      Tanner stage (genital): 5.     Labia:        Right: No rash.        Left: No rash.      Vagina: Bleeding present.     Cervix: Cervical motion tenderness and cervical bleeding present. No discharge, friability, lesion or eversion.  Uterus: Enlarged.      Adnexa:        Right: Tenderness and fullness present. No mass.         Left: No mass, tenderness or fullness.       Rectum: Normal.  Musculoskeletal:        General: Normal range of motion.  Skin:    General: Skin is warm and dry.  Neurological:     Mental Status: She is alert and oriented to person, place, and time.       Assessment & Plan:   Kymia was seen today for menstrual problem.  Diagnoses and all orders for this visit:  Menorrhagia with irregular cycle -     WET PREP FOR TRICH, YEAST, CLUE -     US Pelvis Complete; Future -     US Pelvic Complete With Transvaginal; Future -     ketorolac (TORADOL) injection 60 mg  Vaginal discharge -     Genital culture -     ketorolac (TORADOL) injection 60 mg  PID (acute pelvic inflammatory disease) -     ketorolac (TORADOL) injection 60 mg  Other orders -     doxycycline (VIBRAMYCIN) 100 MG capsule; Take 1 capsule  (100 mg total) by mouth 2 (two) times daily.       I am having Kandace Blitz start on doxycycline. I am also having her maintain her blood glucose meter kit and supplies, Accu-Chek Guide, Lancets 30G, albuterol, and multivitamin. We will continue to administer ketorolac.  Allergies as of 05/21/2022       Reactions   Aspirin Nausea And Vomiting   Bee Venom    Celery Oil Other (See Comments)   migraine        Medication List        Accurate as of May 21, 2022  4:58 PM. If you have any questions, ask your nurse or doctor.          Accu-Chek Guide test strip Generic drug: glucose blood Use as instructed   albuterol 108 (90 Base) MCG/ACT inhaler Commonly known as: VENTOLIN HFA Inhale 2 puffs into the lungs every 6 (six) hours as needed for wheezing or shortness of breath.   blood glucose meter kit and supplies Kit Dispense based on patient and insurance preference. Use up to four times daily as directed.   doxycycline 100 MG capsule Commonly known as: Vibramycin Take 1 capsule (100 mg total) by mouth 2 (two) times daily. Started by: Claretta Fraise, MD   Lancets 30G Misc Check blood sugars twice daily   multivitamin tablet Take 1 tablet by mouth daily.         Follow-up: Return if symptoms worsen or fail to improve.  Claretta Fraise, M.D.

## 2022-05-22 ENCOUNTER — Ambulatory Visit (HOSPITAL_COMMUNITY)
Admission: RE | Admit: 2022-05-22 | Discharge: 2022-05-22 | Disposition: A | Discharge status: Home / Self Care | Payer: Medicaid Other | Source: Ambulatory Visit | Attending: Family Medicine | Admitting: Family Medicine

## 2022-05-22 ENCOUNTER — Other Ambulatory Visit: Payer: Self-pay | Admitting: Family Medicine

## 2022-05-22 DIAGNOSIS — N921 Excessive and frequent menstruation with irregular cycle: Secondary | ICD-10-CM | POA: Diagnosis not present

## 2022-05-22 MED ORDER — MEDROXYPROGESTERONE ACETATE 10 MG PO TABS: 10.0000 mg | ORAL_TABLET | Freq: Every day | ORAL | 0 refills | Status: AC

## 2022-05-24 LAB — GENITAL CULTURE

## 2022-05-24 NOTE — Progress Notes (Signed)
Hello Jessica Ferguson,  Your lab result is normal and/or stable.Some minor variations that are not significant are commonly marked abnormal, but do not represent any medical problem for you.  Best regards, Mechele Claude, M.D.

## 2023-04-15 ENCOUNTER — Ambulatory Visit (INDEPENDENT_AMBULATORY_CARE_PROVIDER_SITE_OTHER): Payer: Medicaid Other | Admitting: Nurse Practitioner

## 2023-04-15 ENCOUNTER — Encounter: Payer: Self-pay | Admitting: Nurse Practitioner

## 2023-04-15 DIAGNOSIS — Z113 Encounter for screening for infections with a predominantly sexual mode of transmission: Secondary | ICD-10-CM

## 2023-04-15 DIAGNOSIS — N760 Acute vaginitis: Secondary | ICD-10-CM | POA: Diagnosis not present

## 2023-04-15 LAB — URINALYSIS, ROUTINE W REFLEX MICROSCOPIC
Bilirubin, UA: NEGATIVE
Glucose, UA: NEGATIVE
Ketones, UA: NEGATIVE
Nitrite, UA: NEGATIVE
Protein,UA: NEGATIVE
Specific Gravity, UA: 1.02 (ref 1.005–1.030)
Urobilinogen, Ur: 0.2 mg/dL (ref 0.2–1.0)
pH, UA: 6.5 (ref 5.0–7.5)

## 2023-04-15 LAB — WET PREP FOR TRICH, YEAST, CLUE
Clue Cell Exam: NEGATIVE
Trichomonas Exam: NEGATIVE
Yeast Exam: POSITIVE — AB

## 2023-04-15 MED ORDER — METRONIDAZOLE 500 MG PO TABS: 500.0000 mg | ORAL_TABLET | Freq: Two times a day (BID) | ORAL | 0 refills | Status: AC

## 2023-04-15 NOTE — Progress Notes (Signed)
   Acute Office Visit  Subjective:     Patient ID: Jessica Ferguson, female    DOB: 12-03-1990, 32 y.o.   MRN: [id number]  No chief complaint on file.  HPI Jessica Ferguson is a 32 year old female here for an acute visit complaining of dysuria, burning x 3 days, without flank pain, fever, chills. Reports vaginal discharge "cheese curded". She reports taking AZO with minimal. She is sexually active and denies past history of STIs. Vaginal swab and urine specimen obtain. Had hysterectomy , no HCG needed today. No other concerns   ROS Negative unless indicated in HPI    Objective:    There were no vitals taken for this visit. BP Readings from Last 3 Encounters:  05/21/22 109/70  12/29/21 137/89  12/08/21 134/72   Wt Readings from Last 3 Encounters:  05/21/22 107 lb (48.5 kg)  12/29/21 105 lb (47.6 kg)  12/08/21 103 lb 12.8 oz (47.1 kg)      Physical Exam Appears well, in no apparent distress.  Vital signs are normal. The abdomen is soft without tenderness, guarding, mass, rebound or organomegaly. No CVA tenderness or inguinal adenopathy noted. Urine dipstick shows positive for 1+ RBC's, positive for nitrates, and positive for 2+leukocytes.  Micro exam: TNP WBC's per HPF and TNP RBC's per HPF. Vaginal swab Clue cell negative, trichomonas negative, + yeast, WBC >25, Bacteria moderate, epithelial cell few  Results for orders placed or performed in visit on 04/15/23  WET PREP FOR TRICH, YEAST, CLUE   Specimen: Vaginal Swab   Vaginal Swab  Result Value Ref Range   Trichomonas Exam Negative Negative   Yeast Exam Positive (A) Negative   Clue Cell Exam Negative Negative  Urinalysis, Routine w reflex microscopic  Result Value Ref Range   Specific Gravity, UA 1.020 1.005 - 1.030   pH, UA 6.5 5.0 - 7.5   Color, UA Yellow Yellow   Appearance Ur Clear Clear   Leukocytes,UA 2+ (A) Negative   Protein,UA Negative Negative/Trace   Glucose, UA Negative Negative   Ketones, UA Negative  Negative   RBC, UA 1+ (A) Negative   Bilirubin, UA Negative Negative   Urobilinogen, Ur 0.2 0.2 - 1.0 mg/dL   Nitrite, UA Negative Negative   Microscopic Examination CANCELED         Assessment & Plan:  Acute vaginitis -     WET PREP FOR TRICH, YEAST, CLUE -     Urinalysis, Routine w reflex microscopic -     metroNIDAZOLE; Take 1 tablet (500 mg total) by mouth 2 (two) times daily.  Dispense: 14 tablet; Refill: 0  Screening for STD (sexually transmitted disease) -     Ct Ng M genitalium NAA, Urine -     metroNIDAZOLE; Take 1 tablet (500 mg total) by mouth 2 (two) times daily.  Dispense: 14 tablet; Refill: 0   ASSESSMENT: Bacterial vaginosis  PLAN:  Metronidazole 500 mg 1-tab BID for 7-days Increase Hydration Continue OTC AZO At risk for STIs,chlamydia & Gonorrhea ordered . Call or return to clinic prn if these symptoms worsen or fail to improve as anticipated.  Urinate post coital Return if symptoms worsen or fail to improve.  Arrie Aran Santa Lighter, DNP [facility] [address] [place], [place] [address] [phone]

## 2023-04-17 LAB — CT NG M GENITALIUM NAA, URINE
Chlamydia trachomatis, NAA: NEGATIVE
Mycoplasma genitalium NAA: NEGATIVE
Neisseria gonorrhoeae, NAA: NEGATIVE

## 2023-04-17 NOTE — Progress Notes (Signed)
Chlamydia and gonorrhea negative.

## 2023-08-07 ENCOUNTER — Ambulatory Visit: Payer: Medicaid Other | Admitting: Family Medicine

## 2023-08-19 ENCOUNTER — Telehealth (INDEPENDENT_AMBULATORY_CARE_PROVIDER_SITE_OTHER): Payer: Medicaid Other | Admitting: Family Medicine

## 2023-08-19 DIAGNOSIS — Z91199 Patient's noncompliance with other medical treatment and regimen due to unspecified reason: Secondary | ICD-10-CM

## 2023-08-19 NOTE — Progress Notes (Signed)
No answer. Unable to leave voice mail. No response to video link.
